# Patient Record
Sex: Female | Born: 1975
Health system: Southern US, Community
[De-identification: ages and names within clinical notes are randomized; demographics above are authoritative.]

## PROBLEM LIST (undated history)

## (undated) DIAGNOSIS — T7840XA Allergy, unspecified, initial encounter: Secondary | ICD-10-CM

## (undated) DIAGNOSIS — N62 Hypertrophy of breast: Secondary | ICD-10-CM

## (undated) DIAGNOSIS — S61419A Laceration without foreign body of unspecified hand, initial encounter: Secondary | ICD-10-CM

## (undated) DIAGNOSIS — M542 Cervicalgia: Secondary | ICD-10-CM

## (undated) DIAGNOSIS — M549 Dorsalgia, unspecified: Secondary | ICD-10-CM

## (undated) HISTORY — DX: Allergy, unspecified, initial encounter: T78.40XA

## (undated) HISTORY — DX: Dorsalgia, unspecified: M54.9

## (undated) HISTORY — DX: Cervicalgia: M54.2

---

## 2011-01-31 ENCOUNTER — Encounter: Payer: Self-pay | Admitting: Internal Medicine

## 2011-01-31 ENCOUNTER — Ambulatory Visit (INDEPENDENT_AMBULATORY_CARE_PROVIDER_SITE_OTHER): Payer: 59 | Admitting: Internal Medicine

## 2011-01-31 VITALS — BP 118/60 | HR 80 | Temp 98.5°F | Ht 60.0 in | Wt 132.0 lb

## 2011-01-31 DIAGNOSIS — J45909 Unspecified asthma, uncomplicated: Secondary | ICD-10-CM

## 2011-01-31 DIAGNOSIS — Z23 Encounter for immunization: Secondary | ICD-10-CM

## 2011-01-31 DIAGNOSIS — J309 Allergic rhinitis, unspecified: Secondary | ICD-10-CM
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
## 2011-02-24 ENCOUNTER — Encounter: Payer: Self-pay | Admitting: Internal Medicine

## 2011-02-24 DIAGNOSIS — J309 Allergic rhinitis, unspecified: Secondary | ICD-10-CM | POA: Insufficient documentation

## 2011-02-24 DIAGNOSIS — J45909 Unspecified asthma, uncomplicated: Secondary | ICD-10-CM | POA: Insufficient documentation

## 2011-02-24 NOTE — Patient Instructions (Signed)
Please continue asthma medications as needed. Return in one year or as needed

## 2011-03-12 ENCOUNTER — Other Ambulatory Visit: Payer: 59 | Admitting: Internal Medicine

## 2011-03-15 ENCOUNTER — Encounter: Payer: 59 | Admitting: Internal Medicine

## 2011-05-14 ENCOUNTER — Other Ambulatory Visit: Payer: 59 | Admitting: Internal Medicine

## 2011-05-16 ENCOUNTER — Encounter: Payer: 59 | Admitting: Internal Medicine

## 2012-07-30 ENCOUNTER — Other Ambulatory Visit (HOSPITAL_COMMUNITY)
Admission: RE | Admit: 2012-07-30 | Discharge: 2012-07-30 | Disposition: A | Discharge status: Home / Self Care | Payer: 59 | Source: Ambulatory Visit | Attending: Obstetrics and Gynecology | Admitting: Obstetrics and Gynecology

## 2012-07-30 ENCOUNTER — Other Ambulatory Visit: Payer: Self-pay | Admitting: Nurse Practitioner

## 2012-07-30 DIAGNOSIS — Z01419 Encounter for gynecological examination (general) (routine) without abnormal findings: Secondary | ICD-10-CM | POA: Insufficient documentation

## 2012-07-30 DIAGNOSIS — Z1151 Encounter for screening for human papillomavirus (HPV): Secondary | ICD-10-CM | POA: Insufficient documentation

## 2014-04-18 ENCOUNTER — Other Ambulatory Visit: Payer: Self-pay | Admitting: Internal Medicine

## 2014-04-18 DIAGNOSIS — Z1231 Encounter for screening mammogram for malignant neoplasm of breast: Secondary | ICD-10-CM

## 2014-05-04 ENCOUNTER — Ambulatory Visit: Payer: 59

## 2014-05-13 ENCOUNTER — Ambulatory Visit
Admission: RE | Admit: 2014-05-13 | Discharge: 2014-05-13 | Disposition: A | Discharge status: Home / Self Care | Payer: 59 | Source: Ambulatory Visit | Attending: Internal Medicine | Admitting: Internal Medicine

## 2014-05-13 DIAGNOSIS — Z1231 Encounter for screening mammogram for malignant neoplasm of breast: Secondary | ICD-10-CM

## 2015-01-26 ENCOUNTER — Emergency Department (HOSPITAL_COMMUNITY)
Admission: EM | Admit: 2015-01-26 | Discharge: 2015-01-26 | Disposition: A | Discharge status: Home / Self Care | Payer: 59 | Attending: Emergency Medicine | Admitting: Emergency Medicine

## 2015-01-26 ENCOUNTER — Encounter (HOSPITAL_COMMUNITY): Payer: Self-pay | Admitting: Emergency Medicine

## 2015-01-26 DIAGNOSIS — S61212A Laceration without foreign body of right middle finger without damage to nail, initial encounter: Secondary | ICD-10-CM | POA: Diagnosis not present

## 2015-01-26 DIAGNOSIS — S61214A Laceration without foreign body of right ring finger without damage to nail, initial encounter: Secondary | ICD-10-CM | POA: Insufficient documentation

## 2015-01-26 DIAGNOSIS — Y9289 Other specified places as the place of occurrence of the external cause: Secondary | ICD-10-CM | POA: Diagnosis not present

## 2015-01-26 DIAGNOSIS — Y9389 Activity, other specified: Secondary | ICD-10-CM | POA: Diagnosis not present

## 2015-01-26 DIAGNOSIS — Z91040 Latex allergy status: Secondary | ICD-10-CM | POA: Diagnosis not present

## 2015-01-26 DIAGNOSIS — Z7951 Long term (current) use of inhaled steroids: Secondary | ICD-10-CM | POA: Insufficient documentation

## 2015-01-26 DIAGNOSIS — Y998 Other external cause status: Secondary | ICD-10-CM | POA: Insufficient documentation

## 2015-01-26 DIAGNOSIS — S61216A Laceration without foreign body of right little finger without damage to nail, initial encounter: Secondary | ICD-10-CM | POA: Insufficient documentation

## 2015-01-26 DIAGNOSIS — W260XXA Contact with knife, initial encounter: Secondary | ICD-10-CM | POA: Diagnosis not present

## 2015-01-26 DIAGNOSIS — J45909 Unspecified asthma, uncomplicated: Secondary | ICD-10-CM | POA: Insufficient documentation

## 2015-01-26 DIAGNOSIS — S61219A Laceration without foreign body of unspecified finger without damage to nail, initial encounter: Secondary | ICD-10-CM

## 2015-01-26 DIAGNOSIS — Z23 Encounter for immunization: Secondary | ICD-10-CM | POA: Diagnosis not present

## 2015-01-26 MED ORDER — TETANUS-DIPHTH-ACELL PERTUSSIS 5-2.5-18.5 LF-MCG/0.5 IM SUSP
0.5000 mL | Freq: Once | INTRAMUSCULAR | Status: AC
  Administered 2015-01-26: 0.5 mL via INTRAMUSCULAR
  Filled 2015-01-26: qty 0.5

## 2015-01-26 MED ORDER — CEPHALEXIN 500 MG PO CAPS: 500.0000 mg | ORAL_CAPSULE | Freq: Three times a day (TID) | ORAL | Status: DC

## 2015-01-26 MED ORDER — CEPHALEXIN 500 MG PO CAPS
500.0000 mg | ORAL_CAPSULE | Freq: Once | ORAL | Status: AC
  Administered 2015-01-26: 500 mg via ORAL
  Filled 2015-01-26: qty 1

## 2015-01-26 MED ORDER — LIDOCAINE HCL (PF) 1 % IJ SOLN
5.0000 mL | Freq: Once | INTRAMUSCULAR | Status: AC
  Administered 2015-01-26: 5 mL
  Filled 2015-01-26: qty 5
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
## 2015-01-26 NOTE — Discharge Instructions (Signed)
Call Dr. Levell July office in the morning and tell them you were seen in the ED with a tendon laceration and need to follow up in the office. Take the Keflex as directed and take tylenol or advil for pain.

## 2015-01-26 NOTE — ED Provider Notes (Signed)
CSN: [id number]     Arrival date & time 01/26/15  1926 History  By signing my name below, I, Irene Pap, attest that this documentation has been prepared under the direction and in the presence of [facility], NP-C. Electronically Signed: Irene Pap, ED Scribe. 01/26/2015. 9:46 PM.  Chief Complaint  Patient presents with  . Extremity Laceration   Patient is a 39 y.o. female presenting with skin laceration. The history is provided by the patient. No language interpreter was used.  Laceration Location:  Hand Hand laceration location:  R hand and R finger Depth:  Cutaneous Quality: straight   Bleeding: controlled   Time since incident:  30 minutes Laceration mechanism:  Knife Pain details:    Severity:  Mild   Timing:  Constant   Progression:  Unchanged Foreign body present:  No foreign bodies Ineffective treatments:  None tried Tetanus status:  Unknown  HPI Comments: Kathryn Cohen is a 39 y.o. female who presents to the Emergency Department complaining of a laceration to the 3rd, 4th, and 5th fingers of the right hand onset 30 minutes ago. Pt states she cut her hand with a kitchen knife. She reports her pain 4/10 currently. Denies numbness. Pt does not know when her last tdap was.   Past Medical History  Diagnosis Date  . Asthma   . Allergy   . Back pain   . Neck pain    History reviewed. No pertinent past surgical history. Family History  Problem Relation Age of Onset  . Lupus Sister   . CAD Father   . Heart attack Other    Social History  Substance Use Topics  . Smoking status: Never Smoker   . Smokeless tobacco: None  . Alcohol Use: Yes     Comment: social   OB History    No data available     Review of Systems  Skin: Positive for wound.  All other systems reviewed and are negative.  Allergies  Latex  Home Medications   Prior to Admission medications   Medication Sig Start Date End Date Taking? Authorizing Provider  Albuterol (VENTOLIN IN)  Inhale into the lungs.      Historical Provider, MD  cephALEXin (KEFLEX) 500 MG capsule Take 1 capsule (500 mg total) by mouth 3 (three) times daily. 01/26/15   Hope Bunnie Pion, NP  fluticasone (FLONASE) 50 MCG/ACT nasal spray Place 2 sprays into the nose daily.      Historical Provider, MD  Fluticasone-Salmeterol (ADVAIR DISKUS IN) Inhale into the lungs.      Historical Provider, MD  montelukast (SINGULAIR) 10 MG tablet Take 10 mg by mouth at bedtime.      Historical Provider, MD   BP 113/56 mmHg  Pulse 74  Temp(Src) 98.3 F (36.8 C) (Oral)  Resp 18  SpO2 98%  LMP 12/31/2014 (Approximate)  Physical Exam  Constitutional: She is oriented to person, place, and time. She appears well-developed and well-nourished.  HENT:  Head: Normocephalic and atraumatic.  Eyes: EOM are normal.  Neck: Normal range of motion. Neck supple.  Cardiovascular: Normal rate.   Pulmonary/Chest: Effort normal.  Musculoskeletal: Normal range of motion.       Hands: Unable to flex ring finger, tendon visible with laceration.   Neurological: She is alert and oriented to person, place, and time.  Skin: Skin is warm and dry.  Laceration to PIP of middle, ring and pinky fingers and lacerated tendon to the ring finger, all on palmar surface of right hand.  Cap refill < 3 seconds. Equal sensation in all fingers.   Psychiatric: She has a normal mood and affect. Her behavior is normal.  Nursing note and vitals reviewed.   ED Course  Procedures (including critical care time) DIAGNOSTIC STUDIES: Oxygen Saturation is 98% on RA, normal by my interpretation.    COORDINATION OF CARE: 8:12 PM-Discussed treatment plan which includes call to hand surgeon and laceration repair with pt at bedside and pt agreed to plan.   8:44 PM- Consult with Dr. Fredna Dow. Will give pt dorsal hand splint after wounds cleaned and sutured.   LACERATION REPAIR Performed by: Debroah Baller, NP-C Consent: Verbal consent obtained. Risks and benefits:  risks, benefits and alternatives were discussed Patient identity confirmed: provided demographic data Time out performed prior to procedure Prepped and Draped in normal sterile fashion Wound explored Laceration Location: Palmar surface of right middle, ring and fifth fingers Laceration Length total of the three: 5.5 cm No Foreign Bodies seen or palpated Anesthesia: local infiltration Local anesthetic: lidocaine 1% w/o epinephrine Anesthetic total: 5 ml Irrigation method: syringe Amount of cleaning: standard Skin closure: 5.0 prolene Number of sutures: 9 total  Technique: simple interrupted Patient tolerance: Patient tolerated the procedure well with no immediate complications. Dressing Volar splint Keflex and follow up with Dr. Fredna Dow, she will call in the morning for follow up.  MDM  39 y.o. female with laceration to middle, ring and little finger of right hand stable for d/c to follow up with Dr. Fredna Dow. Discussed with the patient and all questioned fully answered. She will return here as needed.   Final diagnoses:  Laceration of finger with tendon involvement, initial encounter   I personally performed the services described in this documentation, which was scribed in my presence. The recorded information has been reviewed and is accurate.    [place], NP 01/29/15 1635  Harvel Quale, MD 01/30/15 2142

## 2015-01-26 NOTE — ED Notes (Signed)
Suture tray at bedside.

## 2015-01-30 ENCOUNTER — Other Ambulatory Visit: Payer: Self-pay | Admitting: Orthopedic Surgery

## 2015-01-31 ENCOUNTER — Encounter (HOSPITAL_BASED_OUTPATIENT_CLINIC_OR_DEPARTMENT_OTHER): Payer: Self-pay | Admitting: *Deleted

## 2015-02-02 ENCOUNTER — Ambulatory Visit (HOSPITAL_BASED_OUTPATIENT_CLINIC_OR_DEPARTMENT_OTHER)
Admission: RE | Admit: 2015-02-02 | Discharge: 2015-02-02 | Disposition: A | Discharge status: Home / Self Care | Payer: 59 | Source: Ambulatory Visit | Attending: Orthopedic Surgery | Admitting: Orthopedic Surgery

## 2015-02-02 ENCOUNTER — Encounter (HOSPITAL_BASED_OUTPATIENT_CLINIC_OR_DEPARTMENT_OTHER): Payer: Self-pay | Admitting: *Deleted

## 2015-02-02 ENCOUNTER — Encounter (HOSPITAL_BASED_OUTPATIENT_CLINIC_OR_DEPARTMENT_OTHER)
Admission: RE | Disposition: A | Discharge status: Home / Self Care | Payer: Self-pay | Source: Ambulatory Visit | Attending: Orthopedic Surgery

## 2015-02-02 ENCOUNTER — Ambulatory Visit (HOSPITAL_BASED_OUTPATIENT_CLINIC_OR_DEPARTMENT_OTHER): Payer: 59 | Admitting: Anesthesiology

## 2015-02-02 DIAGNOSIS — S65514A Laceration of blood vessel of right ring finger, initial encounter: Secondary | ICD-10-CM | POA: Diagnosis not present

## 2015-02-02 DIAGNOSIS — S64492A Injury of digital nerve of right middle finger, initial encounter: Secondary | ICD-10-CM | POA: Insufficient documentation

## 2015-02-02 DIAGNOSIS — Y998 Other external cause status: Secondary | ICD-10-CM | POA: Diagnosis not present

## 2015-02-02 DIAGNOSIS — S64494A Injury of digital nerve of right ring finger, initial encounter: Secondary | ICD-10-CM | POA: Insufficient documentation

## 2015-02-02 DIAGNOSIS — S66124A Laceration of flexor muscle, fascia and tendon of right ring finger at wrist and hand level, initial encounter: Secondary | ICD-10-CM | POA: Diagnosis not present

## 2015-02-02 DIAGNOSIS — W260XXA Contact with knife, initial encounter: Secondary | ICD-10-CM | POA: Insufficient documentation

## 2015-02-02 DIAGNOSIS — S65516A Laceration of blood vessel of right little finger, initial encounter: Secondary | ICD-10-CM | POA: Insufficient documentation

## 2015-02-02 DIAGNOSIS — Y93G1 Activity, food preparation and clean up: Secondary | ICD-10-CM | POA: Diagnosis not present

## 2015-02-02 DIAGNOSIS — Z91040 Latex allergy status: Secondary | ICD-10-CM | POA: Insufficient documentation

## 2015-02-02 DIAGNOSIS — S66126A Laceration of flexor muscle, fascia and tendon of right little finger at wrist and hand level, initial encounter: Secondary | ICD-10-CM | POA: Diagnosis not present

## 2015-02-02 DIAGNOSIS — S65512A Laceration of blood vessel of right middle finger, initial encounter: Secondary | ICD-10-CM | POA: Insufficient documentation

## 2015-02-02 DIAGNOSIS — S64496A Injury of digital nerve of right little finger, initial encounter: Secondary | ICD-10-CM | POA: Insufficient documentation

## 2015-02-02 DIAGNOSIS — J45909 Unspecified asthma, uncomplicated: Secondary | ICD-10-CM | POA: Insufficient documentation

## 2015-02-02 DIAGNOSIS — Y92090 Kitchen in other non-institutional residence as the place of occurrence of the external cause: Secondary | ICD-10-CM | POA: Diagnosis not present

## 2015-02-02 DIAGNOSIS — S66122A Laceration of flexor muscle, fascia and tendon of right middle finger at wrist and hand level, initial encounter: Secondary | ICD-10-CM | POA: Insufficient documentation

## 2015-02-02 HISTORY — DX: Laceration without foreign body of unspecified hand, initial encounter: S61.419A

## 2015-02-02 HISTORY — PX: NERVE, TENDON AND ARTERY REPAIR: SHX5695

## 2015-02-02 SURGERY — NERVE, TENDON AND ARTERY REPAIR
Anesthesia: General | Site: Finger | Laterality: Right

## 2015-02-02 MED ORDER — LIDOCAINE HCL (PF) 1 % IJ SOLN
INTRAMUSCULAR | Status: AC
  Filled 2015-02-02: qty 30

## 2015-02-02 MED ORDER — PROMETHAZINE HCL 25 MG/ML IJ SOLN: 6.2500 mg | INTRAMUSCULAR | Status: DC | PRN

## 2015-02-02 MED ORDER — HYDROCODONE-ACETAMINOPHEN 5-325 MG PO TABS: ORAL_TABLET | ORAL | Status: DC

## 2015-02-02 MED ORDER — MIDAZOLAM HCL 2 MG/2ML IJ SOLN
INTRAMUSCULAR | Status: AC
  Filled 2015-02-02: qty 4

## 2015-02-02 MED ORDER — DEXAMETHASONE SODIUM PHOSPHATE 10 MG/ML IJ SOLN
INTRAMUSCULAR | Status: AC
  Filled 2015-02-02: qty 1

## 2015-02-02 MED ORDER — LIDOCAINE HCL (CARDIAC) 20 MG/ML IV SOLN
INTRAVENOUS | Status: AC
  Filled 2015-02-02: qty 5

## 2015-02-02 MED ORDER — SCOPOLAMINE 1 MG/3DAYS TD PT72: 1.0000 | MEDICATED_PATCH | Freq: Once | TRANSDERMAL | Status: DC | PRN

## 2015-02-02 MED ORDER — 0.9 % SODIUM CHLORIDE (POUR BTL) OPTIME
TOPICAL | Status: DC | PRN
  Administered 2015-02-02: 200 mL

## 2015-02-02 MED ORDER — ONDANSETRON HCL 4 MG/2ML IJ SOLN
INTRAMUSCULAR | Status: AC
  Filled 2015-02-02: qty 2

## 2015-02-02 MED ORDER — FENTANYL CITRATE (PF) 100 MCG/2ML IJ SOLN: 25.0000 ug | INTRAMUSCULAR | Status: DC | PRN

## 2015-02-02 MED ORDER — FENTANYL CITRATE (PF) 100 MCG/2ML IJ SOLN
INTRAMUSCULAR | Status: AC
  Filled 2015-02-02: qty 2

## 2015-02-02 MED ORDER — MIDAZOLAM HCL 2 MG/2ML IJ SOLN
INTRAMUSCULAR | Status: AC
  Filled 2015-02-02: qty 2

## 2015-02-02 MED ORDER — KETOROLAC TROMETHAMINE 30 MG/ML IJ SOLN
INTRAMUSCULAR | Status: DC | PRN
  Administered 2015-02-02: 30 mg via INTRAVENOUS

## 2015-02-02 MED ORDER — LACTATED RINGERS IV SOLN
INTRAVENOUS | Status: DC
  Administered 2015-02-02 (×2): via INTRAVENOUS

## 2015-02-02 MED ORDER — SUCCINYLCHOLINE CHLORIDE 20 MG/ML IJ SOLN
INTRAMUSCULAR | Status: AC
  Filled 2015-02-02: qty 1

## 2015-02-02 MED ORDER — CEFAZOLIN SODIUM-DEXTROSE 2-3 GM-% IV SOLR
2.0000 g | INTRAVENOUS | Status: AC
  Administered 2015-02-02: 2 g via INTRAVENOUS

## 2015-02-02 MED ORDER — GLYCOPYRROLATE 0.2 MG/ML IJ SOLN: 0.2000 mg | Freq: Once | INTRAMUSCULAR | Status: DC | PRN

## 2015-02-02 MED ORDER — LIDOCAINE-EPINEPHRINE (PF) 1.5 %-1:200000 IJ SOLN
INTRAMUSCULAR | Status: DC | PRN
  Administered 2015-02-02: 15 mL via PERINEURAL

## 2015-02-02 MED ORDER — MIDAZOLAM HCL 2 MG/2ML IJ SOLN
1.0000 mg | INTRAMUSCULAR | Status: DC | PRN
  Administered 2015-02-02: 1 mg via INTRAVENOUS
  Administered 2015-02-02: 2 mg via INTRAVENOUS

## 2015-02-02 MED ORDER — LIDOCAINE HCL (CARDIAC) 20 MG/ML IV SOLN
INTRAVENOUS | Status: DC | PRN
  Administered 2015-02-02: 60 mg via INTRAVENOUS

## 2015-02-02 MED ORDER — CHLORHEXIDINE GLUCONATE 4 % EX LIQD: 60.0000 mL | Freq: Once | CUTANEOUS | Status: DC

## 2015-02-02 MED ORDER — DEXAMETHASONE SODIUM PHOSPHATE 10 MG/ML IJ SOLN
INTRAMUSCULAR | Status: DC | PRN
  Administered 2015-02-02: 10 mg via INTRAVENOUS

## 2015-02-02 MED ORDER — ROPIVACAINE HCL 5 MG/ML IJ SOLN
INTRAMUSCULAR | Status: DC | PRN
  Administered 2015-02-02: 15 mL via PERINEURAL

## 2015-02-02 MED ORDER — KETOROLAC TROMETHAMINE 30 MG/ML IJ SOLN: 30.0000 mg | Freq: Once | INTRAMUSCULAR | Status: DC

## 2015-02-02 MED ORDER — PROPOFOL 10 MG/ML IV BOLUS
INTRAVENOUS | Status: AC
  Filled 2015-02-02: qty 20

## 2015-02-02 MED ORDER — FENTANYL CITRATE (PF) 100 MCG/2ML IJ SOLN
50.0000 ug | INTRAMUSCULAR | Status: AC | PRN
  Administered 2015-02-02 (×3): 25 ug via INTRAVENOUS
  Administered 2015-02-02: 100 ug via INTRAVENOUS
  Administered 2015-02-02: 25 ug via INTRAVENOUS

## 2015-02-02 MED ORDER — HEPARIN SODIUM (PORCINE) 1000 UNIT/ML IJ SOLN
INTRAMUSCULAR | Status: AC
  Filled 2015-02-02: qty 1

## 2015-02-02 MED ORDER — EPHEDRINE SULFATE 50 MG/ML IJ SOLN
INTRAMUSCULAR | Status: DC | PRN
  Administered 2015-02-02: 10 mg via INTRAVENOUS

## 2015-02-02 MED ORDER — PROPOFOL 10 MG/ML IV BOLUS
INTRAVENOUS | Status: DC | PRN
Start: 2015-02-02 — End: 2015-02-02
  Administered 2015-02-02: 30 mg via INTRAVENOUS
  Administered 2015-02-02: 200 mg via INTRAVENOUS

## 2015-02-02 MED ORDER — FENTANYL CITRATE (PF) 100 MCG/2ML IJ SOLN
INTRAMUSCULAR | Status: AC
  Filled 2015-02-02: qty 4

## 2015-02-02 MED ORDER — ONDANSETRON HCL 4 MG/2ML IJ SOLN
INTRAMUSCULAR | Status: DC | PRN
Start: 2015-02-02 — End: 2015-02-02
  Administered 2015-02-02: 4 mg via INTRAVENOUS

## 2015-02-02 MED ORDER — CEFAZOLIN SODIUM-DEXTROSE 2-3 GM-% IV SOLR
INTRAVENOUS | Status: AC
  Filled 2015-02-02: qty 50

## 2015-02-02 SURGICAL SUPPLY — 63 items
BAG DECANTER FOR FLEXI CONT (MISCELLANEOUS) ×2 IMPLANT
BANDAGE ELASTIC 3 VELCRO ST LF (GAUZE/BANDAGES/DRESSINGS) ×2 IMPLANT
BLADE MINI RND TIP GREEN BEAV (BLADE) IMPLANT
BLADE SURG 15 STRL LF DISP TIS (BLADE) ×2 IMPLANT
BLADE SURG 15 STRL SS (BLADE) ×2
BNDG ESMARK 4X9 LF (GAUZE/BANDAGES/DRESSINGS) ×2 IMPLANT
BNDG GAUZE ELAST 4 BULKY (GAUZE/BANDAGES/DRESSINGS) ×2 IMPLANT
BRUSH SCRUB EZ PLAIN DRY (MISCELLANEOUS) IMPLANT
CATH ROBINSON RED A/P 10FR (CATHETERS) IMPLANT
CHLORAPREP W/TINT 26ML (MISCELLANEOUS) IMPLANT
CORDS BIPOLAR (ELECTRODE) ×2 IMPLANT
COVER BACK TABLE 60X90IN (DRAPES) ×2 IMPLANT
COVER MAYO STAND STRL (DRAPES) ×2 IMPLANT
CUFF TOURNIQUET SINGLE 18IN (TOURNIQUET CUFF) ×2 IMPLANT
DECANTER SPIKE VIAL GLASS SM (MISCELLANEOUS) IMPLANT
DRAPE EXTREMITY T 121X128X90 (DRAPE) ×2 IMPLANT
DRAPE SURG 17X23 STRL (DRAPES) ×2 IMPLANT
GAUZE SPONGE 4X4 12PLY STRL (GAUZE/BANDAGES/DRESSINGS) ×2 IMPLANT
GAUZE XEROFORM 1X8 LF (GAUZE/BANDAGES/DRESSINGS) ×2 IMPLANT
GLOVE BIO SURGEON STRL SZ7.5 (GLOVE) IMPLANT
GLOVE BIOGEL PI IND STRL 8 (GLOVE) ×1 IMPLANT
GLOVE BIOGEL PI IND STRL 8.5 (GLOVE) IMPLANT
GLOVE BIOGEL PI INDICATOR 8 (GLOVE) ×1
GLOVE BIOGEL PI INDICATOR 8.5 (GLOVE)
GLOVE SURG ORTHO 8.0 STRL STRW (GLOVE) IMPLANT
GLOVE SURG SS PI 7.0 STRL IVOR (GLOVE) ×2 IMPLANT
GLOVE SURG SS PI 8.0 STRL IVOR (GLOVE) ×2 IMPLANT
GOWN STRL REUS W/ TWL LRG LVL3 (GOWN DISPOSABLE) ×1 IMPLANT
GOWN STRL REUS W/TWL LRG LVL3 (GOWN DISPOSABLE) ×1
GOWN STRL REUS W/TWL XL LVL3 (GOWN DISPOSABLE) ×4 IMPLANT
LOOP VESSEL MAXI BLUE (MISCELLANEOUS) IMPLANT
NDL SAFETY ECLIPSE 18X1.5 (NEEDLE) IMPLANT
NEEDLE HYPO 18GX1.5 SHARP (NEEDLE)
NEEDLE HYPO 25X1 1.5 SAFETY (NEEDLE) ×2 IMPLANT
NS IRRIG 1000ML POUR BTL (IV SOLUTION) ×2 IMPLANT
PACK BASIN DAY SURGERY FS (CUSTOM PROCEDURE TRAY) ×2 IMPLANT
PAD CAST 3X4 CTTN HI CHSV (CAST SUPPLIES) ×1 IMPLANT
PAD CAST 4YDX4 CTTN HI CHSV (CAST SUPPLIES) IMPLANT
PADDING CAST ABS 4INX4YD NS (CAST SUPPLIES)
PADDING CAST ABS COTTON 4X4 ST (CAST SUPPLIES) IMPLANT
PADDING CAST COTTON 3X4 STRL (CAST SUPPLIES) ×1
PADDING CAST COTTON 4X4 STRL (CAST SUPPLIES)
SLEEVE SCD COMPRESS KNEE MED (MISCELLANEOUS) ×2 IMPLANT
SLING ARM FOAM STRAP MED (SOFTGOODS) ×2 IMPLANT
SPEAR EYE SURG WECK-CEL (MISCELLANEOUS) ×2 IMPLANT
SPLINT PLASTER CAST XFAST 3X15 (CAST SUPPLIES) IMPLANT
SPLINT PLASTER XTRA FASTSET 3X (CAST SUPPLIES)
STOCKINETTE 4X48 STRL (DRAPES) ×2 IMPLANT
SUT ETHIBOND 3-0 V-5 (SUTURE) IMPLANT
SUT ETHILON 4 0 PS 2 18 (SUTURE) ×6 IMPLANT
SUT FIBERWIRE 4-0 18 TAPR NDL (SUTURE) ×2
SUT MERSILENE 6 0 P 1 (SUTURE) IMPLANT
SUT NYLON 9 0 VRM6 (SUTURE) ×4 IMPLANT
SUT PROLENE 6 0 P 1 18 (SUTURE) IMPLANT
SUT SILK 4 0 PS 2 (SUTURE) IMPLANT
SUT SUPRAMID 4-0 (SUTURE) IMPLANT
SUT VICRYL 4-0 PS2 18IN ABS (SUTURE) ×4 IMPLANT
SUTURE FIBERWR 4-0 18 TAPR NDL (SUTURE) ×1 IMPLANT
SYR BULB 3OZ (MISCELLANEOUS) ×2 IMPLANT
SYR CONTROL 10ML LL (SYRINGE) IMPLANT
TOWEL OR 17X24 6PK STRL BLUE (TOWEL DISPOSABLE) ×4 IMPLANT
TRAY DSU PREP LF (CUSTOM PROCEDURE TRAY) ×2 IMPLANT
UNDERPAD 30X30 (UNDERPADS AND DIAPERS) ×2 IMPLANT

## 2015-02-02 NOTE — Progress Notes (Signed)
Assisted Dr. Kalman Shan with right, axillary block. Side rails up, monitors on throughout procedure. See vital signs in flow sheet. Tolerated Procedure well.

## 2015-02-02 NOTE — Anesthesia Postprocedure Evaluation (Signed)
Anesthesia Post Note  Patient: Kathryn Cohen  Procedure(s) Performed: Procedure(s) (LRB): RIGHT LONG RING SMALL FINGER REPAIR NERVE, TENDON AND ARTERY REPAIR (Right)  Anesthesia type: general  Patient location: PACU  Post pain: Pain level controlled  Post assessment: Patient's Cardiovascular Status Stable  Last Vitals:  Filed Vitals:   02/02/15 1615  BP: 119/61  Pulse: 77  Temp:   Resp: 10    Post vital signs: Reviewed and stable  Level of consciousness: sedated  Complications: No apparent anesthesia complications

## 2015-02-02 NOTE — Op Note (Signed)
Intra-operative fluoroscopic images in the AP, lateral, and oblique views were taken and evaluated by myself.  No radio opaque body was noted in the operative area.

## 2015-02-02 NOTE — Op Note (Signed)
[id number] 

## 2015-02-02 NOTE — Anesthesia Preprocedure Evaluation (Addendum)
Anesthesia Evaluation  Patient identified by MRN, date of birth, ID band Patient awake    Reviewed: Allergy & Precautions, NPO status , Patient's Chart, lab work & pertinent test results  History of Anesthesia Complications Negative for: history of anesthetic complications  Airway Mallampati: II  TM Distance: >3 FB Neck ROM: Full    Dental no notable dental hx. (+) Dental Advisory Given   Pulmonary asthma ,    Pulmonary exam normal breath sounds clear to auscultation       Cardiovascular negative cardio ROS Normal cardiovascular exam Rhythm:Regular Rate:Normal     Neuro/Psych negative neurological ROS     GI/Hepatic Neg liver ROS,   Endo/Other  negative endocrine ROS  Renal/GU negative Renal ROS     Musculoskeletal Back pain   Abdominal   Peds  Hematology   Anesthesia Other Findings   Reproductive/Obstetrics                            Anesthesia Physical Anesthesia Plan  ASA: II  Anesthesia Plan: General   Post-op Pain Management:    Induction: Intravenous  Airway Management Planned: LMA  Additional Equipment:   Intra-op Plan:   Post-operative Plan: Extubation in OR  Informed Consent: I have reviewed the patients History and Physical, chart, labs and discussed the procedure including the risks, benefits and alternatives for the proposed anesthesia with the patient or authorized representative who has indicated his/her understanding and acceptance.   Dental advisory given  Plan Discussed with:   Anesthesia Plan Comments:         Anesthesia Quick Evaluation

## 2015-02-02 NOTE — H&P (Signed)
  Kathryn Cohen is an 39 y.o. female.   Chief Complaint: right long/ring/small lacerations HPI: 39 yo rhd female states she was cleaning a kitchen knife 01/26/15 when the knife slipped and lacerated her right long/ring/small fingers.  Seen at [facility] where wounds were cleaned and sutured.  Followed up in office.  Reports no previous injuries to fingers.  Unable to flex fingers.  Past Medical History  Diagnosis Date  . Asthma   . Allergy   . Back pain   . Neck pain   . Laceration of hand     rt long, ring and small fingers    Past Surgical History  Procedure Laterality Date  . No past surgeries      Family History  Problem Relation Age of Onset  . Lupus Sister   . CAD Father   . Heart attack Other    Social History:  reports that she has never smoked. She does not have any smokeless tobacco history on file. She reports that she drinks alcohol. She reports that she does not use illicit drugs.  Allergies:  Allergies  Allergen Reactions  . Latex Rash    Medications Prior to Admission  Medication Sig Dispense Refill  . cephALEXin (KEFLEX) 500 MG capsule Take 1 capsule (500 mg total) by mouth 3 (three) times daily. 21 capsule 0  . fluticasone (FLONASE) 50 MCG/ACT nasal spray Place 2 sprays into the nose daily.      . Fluticasone-Salmeterol (ADVAIR DISKUS IN) Inhale into the lungs.      . montelukast (SINGULAIR) 10 MG tablet Take 10 mg by mouth at bedtime.      . Albuterol (VENTOLIN IN) Inhale into the lungs.        No results found for this or any previous visit (from the past 48 hour(s)).  No results found.   A comprehensive review of systems was negative except for: Eyes: positive for contacts/glasses Respiratory: positive for asthma Musculoskeletal: positive for back pain  Height 5' (1.524 m), weight 58.968 kg (130 lb), last menstrual period 12/31/2014.  General appearance: alert, cooperative and appears stated age Head: Normocephalic, without obvious abnormality,  atraumatic Neck: supple, symmetrical, trachea midline Resp: clear to auscultation bilaterally Cardio: regular rate and rhythm GI: non tedner Extremities: decreased sensation on ulnar side of right long/ring/small fingers.  otherwise intact sensation.  brisk capillary refill all digits.  unable to flex right long/ring/small fingers.  lacerations at pip level right long/ring/small fingers. Pulses: 2+ and symmetric Skin: Skin color, texture, turgor normal. No rashes or lesions Neurologic: Grossly normal Incision/Wound:  as above  Assessment/Plan Right long/ring/small finger lacerations with tendon/artery/nerve lacerations.  Non operative and operative treatment options were discussed with the patient and patient wishes to proceed with operative treatment. Risks, benefits, and alternatives of surgery were discussed and the patient agrees with the plan of care.   Kathryn Cohen R 02/02/2015, 11:05 AM

## 2015-02-02 NOTE — Anesthesia Procedure Notes (Addendum)
Anesthesia Regional Block:  Axillary brachial plexus block  Pre-Anesthetic Checklist: ,, timeout performed, Correct Patient, Correct Site, Correct Laterality, Correct Procedure, Correct Position, site marked, Risks and benefits discussed,  Surgical consent,  Pre-op evaluation,  At surgeon's request and post-op pain management  Laterality: Right  Prep: chloraprep       Needles:  Injection technique: Single-shot  Needle Type: Stimiplex     Needle Length: 4cm 4 cm Needle Gauge: 21 and 21 G    Additional Needles: Axillary brachial plexus block  Nerve Stimulator or Paresthesia:  Response: 0.4 mA,   Additional Responses:   Narrative:  Injection made incrementally with aspirations every 5 mL.  Performed by: Personally   Additional Notes: Patient tolerated the procedure well without complications   Procedure Name: LMA Insertion Date/Time: 02/02/2015 1:13 PM Performed by: Maryella Shivers Pre-anesthesia Checklist: Patient identified, Emergency Drugs available, Suction available and Patient being monitored Patient Re-evaluated:Patient Re-evaluated prior to inductionOxygen Delivery Method: Circle System Utilized Preoxygenation: Pre-oxygenation with 100% oxygen Intubation Type: IV induction Ventilation: Mask ventilation without difficulty LMA: LMA inserted LMA Size: 4.0 Number of attempts: 1 Airway Equipment and Method: Bite block Placement Confirmation: positive ETCO2 Tube secured with: Tape Dental Injury: Teeth and Oropharynx as per pre-operative assessment

## 2015-02-02 NOTE — Discharge Instructions (Addendum)
[facility] Instructions Hand Surgery  Wound Care: Keep your hand elevated above the level of your heart.  Do not allow it to dangle by your side.  Keep the dressing dry and do not remove it unless your doctor advises you to do so.  He will usually change it at the time of your post-op visit.  Moving your fingers is advised to stimulate circulation but will depend on the site of your surgery.  If you have a splint applied, your doctor will advise you regarding movement.  Activity: Do not drive or operate machinery today.  Rest today and then you may return to your normal activity and work as indicated by your physician.  Diet:  Drink liquids today or eat a light diet.  You may resume a regular diet tomorrow.    General expectations: Pain for two to three days. Fingers may become slightly swollen.  Call your doctor if any of the following occur: Severe pain not relieved by pain medication. Elevated temperature. Dressing soaked with blood. Inability to move fingers. White or bluish color to fingers.    Post Anesthesia Home Care Instructions  Activity: Get plenty of rest for the remainder of the day. A responsible adult should stay with you for 24 hours following the procedure.  For the next 24 hours, DO NOT: -Drive a car -Paediatric nurse -Drink alcoholic beverages -Take any medication unless instructed by your physician -Make any legal decisions or sign important papers.  Meals: Start with liquid foods such as gelatin or soup. Progress to regular foods as tolerated. Avoid greasy, spicy, heavy foods. If nausea and/or vomiting occur, drink only clear liquids until the nausea and/or vomiting subsides. Call your physician if vomiting continues.  Special Instructions/Symptoms: Your throat may feel dry or sore from the anesthesia or the breathing tube placed in your throat during surgery. If this causes discomfort, gargle with warm salt water. The discomfort should disappear within  24 hours.  If you had a scopolamine patch placed behind your ear for the management of post- operative nausea and/or vomiting:  1. The medication in the patch is effective for 72 hours, after which it should be removed.  Wrap patch in a tissue and discard in the trash. Wash hands thoroughly with soap and water. 2. You may remove the patch earlier than 72 hours if you experience unpleasant side effects which may include dry mouth, dizziness or visual disturbances. 3. Avoid touching the patch. Wash your hands with soap and water after contact with the patch.    Regional Anesthesia Blocks  1. Numbness or the inability to move the "blocked" extremity may last from 3-48 hours after placement. The length of time depends on the medication injected and your individual response to the medication. If the numbness is not going away after 48 hours, call your surgeon.  2. The extremity that is blocked will need to be protected until the numbness is gone and the  Strength has returned. Because you cannot feel it, you will need to take extra care to avoid injury. Because it may be weak, you may have difficulty moving it or using it. You may not know what position it is in without looking at it while the block is in effect.  3. For blocks in the legs and feet, returning to weight bearing and walking needs to be done carefully. You will need to wait until the numbness is entirely gone and the strength has returned. You should be able to move your  leg and foot normally before you try and bear weight or walk. You will need someone to be with you when you first try to ensure you do not fall and possibly risk injury.  4. Bruising and tenderness at the needle site are common side effects and will resolve in a few days.  5. Persistent numbness or new problems with movement should be communicated to the surgeon or the [place] [phone] [place] [phone]).

## 2015-02-02 NOTE — Brief Op Note (Signed)
02/02/2015  4:03 PM  PATIENT:  Kathryn Cohen  39 y.o. female  PRE-OPERATIVE DIAGNOSIS:  RIGHT LONG RING SMALL TENDON ARTERY NERVE LACERATION  POST-OPERATIVE DIAGNOSIS:  RIGHT LONG RING SMALL TENDON ARTERY NERVE LACERATION  PROCEDURE:  Procedure(s): RIGHT LONG RING SMALL FINGER REPAIR NERVE, TENDON AND ARTERY REPAIR (Right)  SURGEON:  Surgeon(s) and Role:    * Leanora Cover, MD - Primary    * Daryll Brod, MD - Assisting  PHYSICIAN ASSISTANT:   ASSISTANTS: Daryll Brod, MD   ANESTHESIA:   regional and general  EBL:  Total I/O In: 1600 [I.V.:1600] Out: -   BLOOD ADMINISTERED:none  DRAINS: none   LOCAL MEDICATIONS USED:  NONE  SPECIMEN:  No Specimen  DISPOSITION OF SPECIMEN:  N/A  COUNTS:  NO Micro suture needle lost and ACTION TAKEN: X-RAY(S) TAKEN No radio opaque foreign body noted., ROOM SEARCHED and PATIENT AND/OR FAMILY AWARE  TOURNIQUET:   Total Tourniquet Time Documented: Upper Arm (Right) - 132 minutes Total: Upper Arm (Right) - 132 minutes   DICTATION: .Other Dictation: Dictation Number [id number]  PLAN OF CARE: Discharge to home after PACU  PATIENT DISPOSITION:  PACU - hemodynamically stable.

## 2015-02-02 NOTE — Transfer of Care (Signed)
Immediate Anesthesia Transfer of Care Note  Patient: Kathryn Cohen  Procedure(s) Performed: Procedure(s): RIGH LONG RING SMALL FINGER REPAIR NERVE, TENDON AND ARTERY REPAIR (Right)  Patient Location: PACU  Anesthesia Type:GA combined with regional for post-op pain  Level of Consciousness: sedated  Airway & Oxygen Therapy: Patient Spontanous Breathing and Patient connected to face mask oxygen  Post-op Assessment: Report given to RN and Post -op Vital signs reviewed and stable  Post vital signs: Reviewed and stable  Last Vitals:  Filed Vitals:   02/02/15 1302  BP:   Pulse: 64  Temp:   Resp: 14    Complications: No apparent anesthesia complications

## 2015-02-03 ENCOUNTER — Encounter (HOSPITAL_BASED_OUTPATIENT_CLINIC_OR_DEPARTMENT_OTHER): Payer: Self-pay | Admitting: Orthopedic Surgery

## 2015-02-03 NOTE — Op Note (Signed)
[name], STREB NO.:  [id number]  MEDICAL RECORD NO.:  [id number]  LOCATION:                               FACILITY:  [facility]  PHYSICIAN:  Leanora Cover, MD        DATE OF BIRTH:  [birth year]  DATE OF PROCEDURE:  02/02/2015 DATE OF DISCHARGE:  02/02/2015                              OPERATIVE REPORT   PREOPERATIVE DIAGNOSES:  Right long, ring, and small finger tendon, artery, and nerve laceration.  POSTOPERATIVE DIAGNOSES: 1. Right long finger FDP zone II laceration 2. Right long finger FDS zone II laceration 3. Right long finger ulnar digital nerve laceration  4. Right long finger ulnar digital artery laceration 5. Right ring finger FDP zone II laceration 6. Right ring finger FDS zone II laceration 7. Right ring finger ulnar digital nerve laceration  8. Right ring finger ulnar digital artery laceration 9. Right small finger FDP zone II laceration 10. Right small finger FDS zone II laceration 11. Right small finger ulnar digital nerve laceration  12. Right small finger ulnar digital artery laceration  PROCEDURE: 1. Right long finger repair of FDP zone II laceration 2. Right long finger repair of FDS zone II laceration 2. Right long finger repair of ulnar digital nerve under microscope 4. Right long finger repair of ulnar digital artery under microscope 5. Right ring finger repair of FDP zone II laceration 6. Right ring finger repair of FDS zone II laceration 7. Right ring finger repair of ulnar digital nerve under microscope 8. Right ring finger repair of ulnar digital artery under microscope 9. Right small finger repair of FDP zone II laceration 10. Right small finger repair of FDS zone II laceration 11. Right small finger repair of ulnar digital nerve under microscope 12. Right small finger repair of ulnar digital artery under microscope  SURGEON:  Leanora Cover, M.D.  ASSISTANT:  Daryll Brod, M.D.  ANESTHESIA:  General with Regional.  IV FLUIDS:   Per anesthesia flow sheet.  ESTIMATED BLOOD LOSS:  Minimal.  COMPLICATIONS:  None.  SPECIMENS:  None.  TOURNIQUET TIME:  132 minutes.  DISPOSITION:  Stable to PACU.  INDICATIONS:  Kathryn Cohen is a 39 year old female, who 1 week ago, lacerated the right long, ring, and small fingers while cleaning a kitchen knife.  She was seen at the emergency department, where the wounds were irrigated and sutured.  She followed up in the office.  She was unable to flex the fingers, the long, ring, and small fingers.  She had decreased sensation on the ulnar side of the long, ring, and small fingers.  We elected to proceed with operative exploration, repair of tendon, artery, and nerve as necessary.  Risks, benefits, and alternatives of surgery were discussed including risk of blood loss; infection; damage to nerves, vessels, tendons, ligaments, and bone; failure of surgery; need for additional surgery; complications with wound healing, continued pain, continued numbness and stiffness.  She voiced understanding of these risks and elected to proceed.  OPERATIVE COURSE:  After being identified preoperatively by myself, the patient and I agreed upon procedure and site of procedure.  Surgical site was marked.  The risks, benefits, and alternatives of surgery were reviewed  and she wished to proceed.  Surgical consent had been signed. She was given IV Ancef as preoperative antibiotic prophylaxis.  She was transferred to the operating room, placed on the operating table in a supine position, with the right upper extremity on arm board.  General anesthesia induced by anesthesiologist.  A regional block had been performed by Anesthesia in preoperative holding.  Right upper extremity was prepped and draped in normal sterile orthopedic fashion.  Surgical pause was performed between surgeons, anesthesia, operating staff, and all were in agreement as to the patient, procedure, site procedure. Tourniquet at  the proximal aspect of the extremity was inflated to 250 mmHg after exsanguination of the limb with an Esmarch bandage.  The wounds were explored and extended both proximally and distally.  There was FDP and FDS laceration in the zone II of the long, ring, and small fingers.  The radial digital nerve and artery were intact, in the long, ring, and small fingers.  The ulnar digital nerve and artery were lacerated in the long, ring, and small fingers.  Attention was turned to the tendons first.  4-0 FiberWire suture was used.  The small finger was addressed first.  The FDP radial slip was repaired with a figure-of- eight suture.  The FDP tendon was repaired with a modified Kessler style suture.  This provided good apposition of the tendon ends.  In the ring finger, both the radial and ulnar slips of the FDS tendon were repaired with the 4-0 FiberWire suture in a figure-of-eight fashion.  The FDP tendon was repaired with modified Kessler core suture and augmented with a 6-0 Prolene running epitendinous suture.  On the small finger, the core suture had been augmented with a figure-of-eight suture to provide better apposition of the tendon ends.  In the long finger, the radial and ulnar arms of the FDS tendon were repaired with figure-of-eight style 4-0 FiberWire suture and the FDP tendon repaired with a modified Kessler core suture with a running epitendinous 6-0 Prolene suture. This provided good apposition of all tendon ends.  The microscope was brought in.  A 9-0 nylon suture was used.  The small finger was addressed first.  The artery was repaired after cleaning the lumen and freshen the ends.  A 9-0 nylon suture was used in an interrupted circumferential fashion.  Good apposition of the arterial ends was obtained.  The ulnar digital nerve was then repaired again in an interrupted circumferential fashion.  The ring finger was addressed. Again, the arterial ends were cleaned.  The artery was  repaired with a 9- 0 nylon suture in an interrupted circumferential fashion.  This provided good apposition of the arterial ends.  The ulnar digital nerve was then repaired again in an interrupted circumferential fashion, providing good apposition of the nerve ends.  In the long finger, the artery was again cleaned, the 9-0 nylon suture was used in an interrupted circumferential fashion to repair the ulnar digital artery.  This provided good apposition of the arterial ends.  The ulnar digital nerve was repaired with the 9-0 nylon suture in an interrupted circumferential fashion providing good apposition.  While placing the first 9-0 nylon suture back into the suture carrier, it popped off from the needle holder.  At the end of the case, radiographs were taken of the patient's hand to ensure that the suture was not within the wound, which it was not.  The room was searched along with the operative field and the suture needle was unable  to be retrieved.  The family was made aware postoperatively. The wounds were copiously irrigated with sterile saline.  They were closed with 4-0 nylon in a horizontal mattress fashion.  They were then dressed with sterile Xeroform, 4x4s, and wrapped with a Kerlix bandage. A dorsal blocking splint was placed and wrapped with Kerlix and Ace bandage.  Tourniquet was deflated at 132 minutes.  Fingertips were pink with brisk capillary refill after deflation of the tourniquet.  The operative drapes were broken down.  The patient was awoken from anesthesia safely. She was transferred back to stretcher and taken to PACU in stable condition.  I will see her back in the office in 1 week for postoperative followup.  I have given her Norco 5/325, 1-2 p.o. q.6 hours p.r.n. pain, dispensed #40.     Leanora Cover, MD     KK/MEDQ  D:  02/02/2015  T:  02/03/2015  Job:  [id number]

## 2015-05-02 DIAGNOSIS — M6281 Muscle weakness (generalized): Secondary | ICD-10-CM | POA: Diagnosis not present

## 2015-05-02 DIAGNOSIS — M24542 Contracture, left hand: Secondary | ICD-10-CM | POA: Diagnosis not present

## 2015-05-02 DIAGNOSIS — M62442 Contracture of muscle, left hand: Secondary | ICD-10-CM | POA: Diagnosis not present

## 2015-05-02 DIAGNOSIS — S66821D Laceration of other specified muscles, fascia and tendons at wrist and hand level, right hand, subsequent encounter: Secondary | ICD-10-CM | POA: Diagnosis not present

## 2015-05-02 DIAGNOSIS — L905 Scar conditions and fibrosis of skin: Secondary | ICD-10-CM | POA: Diagnosis not present

## 2015-05-12 MED FILL — ELINEST-28 TABLET: 0.3-30 | 84 days supply | Qty: 112 | Fill #2

## 2015-05-16 DIAGNOSIS — M24542 Contracture, left hand: Secondary | ICD-10-CM | POA: Diagnosis not present

## 2015-05-16 DIAGNOSIS — M62442 Contracture of muscle, left hand: Secondary | ICD-10-CM | POA: Diagnosis not present

## 2015-05-16 DIAGNOSIS — M6281 Muscle weakness (generalized): Secondary | ICD-10-CM | POA: Diagnosis not present

## 2015-05-16 DIAGNOSIS — S66821D Laceration of other specified muscles, fascia and tendons at wrist and hand level, right hand, subsequent encounter: Secondary | ICD-10-CM | POA: Diagnosis not present

## 2015-05-16 DIAGNOSIS — L905 Scar conditions and fibrosis of skin: Secondary | ICD-10-CM | POA: Diagnosis not present

## 2015-05-22 DIAGNOSIS — M62442 Contracture of muscle, left hand: Secondary | ICD-10-CM | POA: Diagnosis not present

## 2015-05-22 DIAGNOSIS — L905 Scar conditions and fibrosis of skin: Secondary | ICD-10-CM | POA: Diagnosis not present

## 2015-05-22 DIAGNOSIS — M6281 Muscle weakness (generalized): Secondary | ICD-10-CM | POA: Diagnosis not present

## 2015-05-22 DIAGNOSIS — S66821D Laceration of other specified muscles, fascia and tendons at wrist and hand level, right hand, subsequent encounter: Secondary | ICD-10-CM | POA: Diagnosis not present

## 2015-05-23 DIAGNOSIS — Z0000 Encounter for general adult medical examination without abnormal findings: Secondary | ICD-10-CM | POA: Diagnosis not present

## 2015-05-23 DIAGNOSIS — J45909 Unspecified asthma, uncomplicated: Secondary | ICD-10-CM | POA: Diagnosis not present

## 2015-05-30 DIAGNOSIS — M62442 Contracture of muscle, left hand: Secondary | ICD-10-CM | POA: Diagnosis not present

## 2015-05-30 DIAGNOSIS — S66821D Laceration of other specified muscles, fascia and tendons at wrist and hand level, right hand, subsequent encounter: Secondary | ICD-10-CM | POA: Diagnosis not present

## 2015-05-30 DIAGNOSIS — L905 Scar conditions and fibrosis of skin: Secondary | ICD-10-CM | POA: Diagnosis not present

## 2015-05-30 DIAGNOSIS — M6281 Muscle weakness (generalized): Secondary | ICD-10-CM | POA: Diagnosis not present

## 2015-05-30 DIAGNOSIS — M24542 Contracture, left hand: Secondary | ICD-10-CM | POA: Diagnosis not present

## 2015-05-31 DIAGNOSIS — S66127D Laceration of flexor muscle, fascia and tendon of left little finger at wrist and hand level, subsequent encounter: Secondary | ICD-10-CM | POA: Diagnosis not present

## 2015-05-31 DIAGNOSIS — S65507D Unspecified injury of blood vessel of left little finger, subsequent encounter: Secondary | ICD-10-CM | POA: Diagnosis not present

## 2015-05-31 DIAGNOSIS — S65505D Unspecified injury of blood vessel of left ring finger, subsequent encounter: Secondary | ICD-10-CM | POA: Diagnosis not present

## 2015-05-31 DIAGNOSIS — S65503D Unspecified injury of blood vessel of left middle finger, subsequent encounter: Secondary | ICD-10-CM | POA: Diagnosis not present

## 2015-05-31 DIAGNOSIS — S64493D Injury of digital nerve of left middle finger, subsequent encounter: Secondary | ICD-10-CM | POA: Diagnosis not present

## 2015-05-31 DIAGNOSIS — S64497D Injury of digital nerve of left little finger, subsequent encounter: Secondary | ICD-10-CM | POA: Diagnosis not present

## 2015-05-31 DIAGNOSIS — S64495D Injury of digital nerve of left ring finger, subsequent encounter: Secondary | ICD-10-CM | POA: Diagnosis not present

## 2015-05-31 DIAGNOSIS — S66123D Laceration of flexor muscle, fascia and tendon of left middle finger at wrist and hand level, subsequent encounter: Secondary | ICD-10-CM | POA: Diagnosis not present

## 2015-05-31 DIAGNOSIS — S66125D Laceration of flexor muscle, fascia and tendon of left ring finger at wrist and hand level, subsequent encounter: Secondary | ICD-10-CM | POA: Diagnosis not present

## 2015-06-06 DIAGNOSIS — S64493D Injury of digital nerve of left middle finger, subsequent encounter: Secondary | ICD-10-CM | POA: Diagnosis not present

## 2015-06-06 DIAGNOSIS — S66127D Laceration of flexor muscle, fascia and tendon of left little finger at wrist and hand level, subsequent encounter: Secondary | ICD-10-CM | POA: Diagnosis not present

## 2015-06-06 DIAGNOSIS — L905 Scar conditions and fibrosis of skin: Secondary | ICD-10-CM | POA: Diagnosis not present

## 2015-06-06 DIAGNOSIS — M6281 Muscle weakness (generalized): Secondary | ICD-10-CM | POA: Diagnosis not present

## 2015-06-06 DIAGNOSIS — M24542 Contracture, left hand: Secondary | ICD-10-CM | POA: Diagnosis not present

## 2015-06-13 ENCOUNTER — Other Ambulatory Visit: Payer: Self-pay

## 2015-06-13 DIAGNOSIS — L905 Scar conditions and fibrosis of skin: Secondary | ICD-10-CM | POA: Diagnosis not present

## 2015-06-13 DIAGNOSIS — S66127D Laceration of flexor muscle, fascia and tendon of left little finger at wrist and hand level, subsequent encounter: Secondary | ICD-10-CM | POA: Diagnosis not present

## 2015-06-13 DIAGNOSIS — S66125D Laceration of flexor muscle, fascia and tendon of left ring finger at wrist and hand level, subsequent encounter: Secondary | ICD-10-CM | POA: Diagnosis not present

## 2015-06-13 DIAGNOSIS — Z1231 Encounter for screening mammogram for malignant neoplasm of breast: Secondary | ICD-10-CM

## 2015-06-13 DIAGNOSIS — S66123D Laceration of flexor muscle, fascia and tendon of left middle finger at wrist and hand level, subsequent encounter: Secondary | ICD-10-CM | POA: Diagnosis not present

## 2015-06-13 DIAGNOSIS — M6281 Muscle weakness (generalized): Secondary | ICD-10-CM | POA: Diagnosis not present

## 2015-06-19 DIAGNOSIS — M24542 Contracture, left hand: Secondary | ICD-10-CM | POA: Diagnosis not present

## 2015-06-19 DIAGNOSIS — M6281 Muscle weakness (generalized): Secondary | ICD-10-CM | POA: Diagnosis not present

## 2015-06-19 DIAGNOSIS — S66125D Laceration of flexor muscle, fascia and tendon of left ring finger at wrist and hand level, subsequent encounter: Secondary | ICD-10-CM | POA: Diagnosis not present

## 2015-06-19 DIAGNOSIS — L905 Scar conditions and fibrosis of skin: Secondary | ICD-10-CM | POA: Diagnosis not present

## 2015-06-27 DIAGNOSIS — S66123D Laceration of flexor muscle, fascia and tendon of left middle finger at wrist and hand level, subsequent encounter: Secondary | ICD-10-CM | POA: Diagnosis not present

## 2015-06-27 DIAGNOSIS — S66127D Laceration of flexor muscle, fascia and tendon of left little finger at wrist and hand level, subsequent encounter: Secondary | ICD-10-CM | POA: Diagnosis not present

## 2015-06-27 DIAGNOSIS — M6281 Muscle weakness (generalized): Secondary | ICD-10-CM | POA: Diagnosis not present

## 2015-06-27 DIAGNOSIS — M24542 Contracture, left hand: Secondary | ICD-10-CM | POA: Diagnosis not present

## 2015-06-27 DIAGNOSIS — L905 Scar conditions and fibrosis of skin: Secondary | ICD-10-CM | POA: Diagnosis not present

## 2015-07-11 ENCOUNTER — Ambulatory Visit
Admission: RE | Admit: 2015-07-11 | Discharge: 2015-07-11 | Disposition: A | Discharge status: Home / Self Care | Payer: 59 | Source: Ambulatory Visit

## 2015-07-11 DIAGNOSIS — Z1231 Encounter for screening mammogram for malignant neoplasm of breast: Secondary | ICD-10-CM | POA: Diagnosis not present

## 2015-08-03 MED FILL — ELINEST-28 TABLET: 0.3-30 | 84 days supply | Qty: 112 | Fill #3

## 2015-08-22 DIAGNOSIS — Z01419 Encounter for gynecological examination (general) (routine) without abnormal findings: Secondary | ICD-10-CM | POA: Diagnosis not present

## 2015-08-22 DIAGNOSIS — Z6827 Body mass index (BMI) 27.0-27.9, adult: Secondary | ICD-10-CM | POA: Diagnosis not present

## 2015-08-22 DIAGNOSIS — Z1151 Encounter for screening for human papillomavirus (HPV): Secondary | ICD-10-CM | POA: Diagnosis not present

## 2015-08-22 DIAGNOSIS — N939 Abnormal uterine and vaginal bleeding, unspecified: Secondary | ICD-10-CM | POA: Diagnosis not present

## 2015-08-25 MED FILL — NORETHIN-ESTRA-FE 0.8-0.025: 0.8-25 | 28 days supply | Qty: 28 | Fill #0

## 2015-09-19 MED FILL — NORETHIN-ESTRA-FE 0.8-0.025: 0.8-25 | 84 days supply | Qty: 84 | Fill #1

## 2015-11-13 MED FILL — IBUPROFEN 800 MG TABLET: 800 | 30 days supply | Qty: 90 | Fill #2

## 2015-12-15 MED FILL — NORETHIN-ESTRA-FE 0.8-0.025: 0.8-25 | 84 days supply | Qty: 84 | Fill #2

## 2016-01-02 MED FILL — MONTELUKAST SOD 10 MG TAB: 10 | 90 days supply | Qty: 90 | Fill #0

## 2016-01-15 MED FILL — ADVAIR 100/50 DISKUS: 100-50 | 90 days supply | Qty: 180 | Fill #0

## 2016-02-02 DIAGNOSIS — H5213 Myopia, bilateral: Secondary | ICD-10-CM | POA: Diagnosis not present

## 2016-02-21 DIAGNOSIS — R51 Headache: Secondary | ICD-10-CM | POA: Diagnosis not present

## 2016-02-21 DIAGNOSIS — N939 Abnormal uterine and vaginal bleeding, unspecified: Secondary | ICD-10-CM | POA: Diagnosis not present

## 2016-02-27 MED FILL — NORETHIN-ESTRA-FE 0.8-0.025: 0.8-25 | 84 days supply | Qty: 84 | Fill #3

## 2016-04-10 DIAGNOSIS — H1013 Acute atopic conjunctivitis, bilateral: Secondary | ICD-10-CM | POA: Diagnosis not present

## 2016-05-09 MED FILL — NORETHIN-ESTRA-FE 0.8-0.025: 0.8-25 | 84 days supply | Qty: 84 | Fill #4

## 2016-06-04 DIAGNOSIS — E663 Overweight: Secondary | ICD-10-CM | POA: Diagnosis not present

## 2016-06-04 DIAGNOSIS — Z6828 Body mass index (BMI) 28.0-28.9, adult: Secondary | ICD-10-CM | POA: Diagnosis not present

## 2016-06-04 DIAGNOSIS — J45909 Unspecified asthma, uncomplicated: Secondary | ICD-10-CM | POA: Diagnosis not present

## 2016-06-04 DIAGNOSIS — H539 Unspecified visual disturbance: Secondary | ICD-10-CM | POA: Diagnosis not present

## 2016-06-04 DIAGNOSIS — Z0000 Encounter for general adult medical examination without abnormal findings: Secondary | ICD-10-CM | POA: Diagnosis not present

## 2016-07-02 ENCOUNTER — Other Ambulatory Visit: Payer: Self-pay | Admitting: Internal Medicine

## 2016-07-02 DIAGNOSIS — Z1231 Encounter for screening mammogram for malignant neoplasm of breast: Secondary | ICD-10-CM

## 2016-07-19 ENCOUNTER — Ambulatory Visit
Admission: RE | Admit: 2016-07-19 | Discharge: 2016-07-19 | Disposition: A | Discharge status: Home / Self Care | Payer: 59 | Source: Ambulatory Visit | Attending: Internal Medicine | Admitting: Internal Medicine

## 2016-07-19 DIAGNOSIS — Z1231 Encounter for screening mammogram for malignant neoplasm of breast: Secondary | ICD-10-CM | POA: Diagnosis not present

## 2016-07-22 MED FILL — MONTELUKAST SOD 10 MG TAB: 10 | 90 days supply | Qty: 90 | Fill #1

## 2016-07-30 MED FILL — FLUTICASONE PROP 50 MCG SPR: 50 | 90 days supply | Qty: 48 | Fill #0

## 2016-07-30 MED FILL — ADVAIR 100/50 DISKUS: 100-50 | 90 days supply | Qty: 180 | Fill #0

## 2016-08-07 DIAGNOSIS — M542 Cervicalgia: Secondary | ICD-10-CM | POA: Diagnosis not present

## 2016-08-07 DIAGNOSIS — N62 Hypertrophy of breast: Secondary | ICD-10-CM | POA: Diagnosis not present

## 2016-08-07 DIAGNOSIS — M549 Dorsalgia, unspecified: Secondary | ICD-10-CM | POA: Diagnosis not present

## 2016-08-28 DIAGNOSIS — Z6826 Body mass index (BMI) 26.0-26.9, adult: Secondary | ICD-10-CM | POA: Diagnosis not present

## 2016-08-28 DIAGNOSIS — Z01419 Encounter for gynecological examination (general) (routine) without abnormal findings: Secondary | ICD-10-CM | POA: Diagnosis not present

## 2016-09-03 MED FILL — MONO-LINYAH 28 TABLET: 0.25-35 | 28 days supply | Qty: 28 | Fill #0

## 2016-09-25 MED FILL — MONO-LINYAH 28 TABLET: 0.25-35 | 28 days supply | Qty: 28 | Fill #1

## 2016-10-17 MED FILL — MONO-LINYAH 28 TABLET: 0.25-35 | 28 days supply | Qty: 28 | Fill #2

## 2016-10-21 MED FILL — MONTELUKAST SOD 10 MG TAB: 10 | 90 days supply | Qty: 90 | Fill #2

## 2016-10-22 ENCOUNTER — Telehealth: Payer: 59 | Admitting: Family

## 2016-10-22 DIAGNOSIS — J019 Acute sinusitis, unspecified: Secondary | ICD-10-CM

## 2016-10-22 DIAGNOSIS — B9689 Other specified bacterial agents as the cause of diseases classified elsewhere: Secondary | ICD-10-CM

## 2016-10-22 MED ORDER — AMOXICILLIN-POT CLAVULANATE 875-125 MG PO TABS: 1.0000 | ORAL_TABLET | Freq: Two times a day (BID) | ORAL | 0 refills | Status: DC

## 2016-10-22 MED FILL — AMOX-CLAV 875-125 MG TABLET: 875-125 | 7 days supply | Qty: 14 | Fill #0

## 2016-10-22 NOTE — Progress Notes (Signed)

## 2016-11-08 MED FILL — MONO-LINYAH 28 TABLET: 0.25-35 | 28 days supply | Qty: 28 | Fill #0

## 2016-11-29 MED FILL — MONO-LINYAH 28 TABLET: 0.25-35 | 84 days supply | Qty: 112 | Fill #0

## 2017-01-03 MED FILL — FLUTICASONE PROP 50 MCG SPR: 50 | 90 days supply | Qty: 48 | Fill #1

## 2017-01-27 MED FILL — MONTELUKAST SOD 10 MG TAB: 10 | 90 days supply | Qty: 90 | Fill #0

## 2017-02-07 DIAGNOSIS — H5213 Myopia, bilateral: Secondary | ICD-10-CM | POA: Diagnosis not present

## 2017-02-07 DIAGNOSIS — H5310 Unspecified subjective visual disturbances: Secondary | ICD-10-CM | POA: Diagnosis not present

## 2017-02-19 MED FILL — NORG-ETHIN ESTRA 0.25-0.035: 0.25-35 | 84 days supply | Qty: 112 | Fill #1

## 2017-02-26 DIAGNOSIS — M549 Dorsalgia, unspecified: Secondary | ICD-10-CM | POA: Diagnosis not present

## 2017-02-26 DIAGNOSIS — G8929 Other chronic pain: Secondary | ICD-10-CM | POA: Diagnosis not present

## 2017-02-26 DIAGNOSIS — N62 Hypertrophy of breast: Secondary | ICD-10-CM | POA: Diagnosis not present

## 2017-02-26 DIAGNOSIS — M542 Cervicalgia: Secondary | ICD-10-CM | POA: Diagnosis not present

## 2017-02-26 MED FILL — OXYCODONE W/APAP 5/325 TAB: 5-325 | 5 days supply | Qty: 20 | Fill #0

## 2017-02-26 NOTE — H&P (Signed)
Subjective:     Patient ID: Kathryn Cohen is a 41 y.o. female.  HPI  Here for follow up discussion prior to planned breast reduction. Current D cup. Notes several year history of neck and back pain. She has tried multiple modalities over the years including acupuncture, chiropractor, NSAIDs, Physical therapy (states her pain was worse with PT), TENS unit, regular activity including swimming, specialty bras. Notes rashes approximately twice per year that have not required antibiotics, she self manages with washing.    She declines any prescription medication for pain.  MMG 06/2016, benign  Patient known to me as her spouse has breast cancer and I have seen her in consultation. 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  Review of Systems Objective:   Physical Exam  Constitutional: She is oriented to person, place, and time.  Cardiovascular: Normal rate, regular rhythm and normal heart sounds.   Pulmonary/Chest: Effort normal and breath sounds normal.  Lymphadenopathy:    She has no axillary adenopathy.  Neurological: She is alert and oriented to person, place, and time.  Skin:  Fitzpatrick 2   + shoulder grooving bilateral +kyphosis No masses Grade 3 ptosis bilateral, right NAC at lowest point breast nipple pointing down SN to nipple R 32 L 30 cm BW R 18 L 16 cm Nipple to IMF R 14 L 16 cm    Assessment:     Macromastia Chronic neck and back pain    Plan:     Plan bilateral reduction with anchor type scars, possible overnight hospital stay,  drains, post operative visits and limitations, recovery. Diminished sensation nipple and breast skin, risk of nipple loss, wound healing problems, asymmetry, incidental carcinoma, changes with wt gain/loss, aging, unacceptable cosmetic appearance reviewed. Additional risks including but not limited to seroma, hematoma, DVT/PE, cardiopulmonary complications, damage to deeper structures, fat necrosis, need to additional procedures  reviewed. Discussed cannot assure her cup size.  Rx for Percocet given. Plan OP surgery.   Anticipate 325 g reduction from each breast.     Irene Limbo, MD [facility] [phone], pin [phone]

## 2017-02-27 HISTORY — PX: REDUCTION MAMMAPLASTY: SUR839

## 2017-03-11 ENCOUNTER — Other Ambulatory Visit: Payer: Self-pay

## 2017-03-11 ENCOUNTER — Encounter (HOSPITAL_BASED_OUTPATIENT_CLINIC_OR_DEPARTMENT_OTHER): Payer: Self-pay | Admitting: *Deleted

## 2017-03-12 NOTE — Progress Notes (Signed)
Ensure pre surgery drink given with instructions to complete by [place], pt verbalized understanding.

## 2017-03-13 MED FILL — ADVAIR 100/50 DISKUS: 100-50 | 90 days supply | Qty: 180 | Fill #1

## 2017-03-14 ENCOUNTER — Encounter (HOSPITAL_BASED_OUTPATIENT_CLINIC_OR_DEPARTMENT_OTHER): Payer: Self-pay | Admitting: *Deleted

## 2017-03-14 ENCOUNTER — Ambulatory Visit (HOSPITAL_BASED_OUTPATIENT_CLINIC_OR_DEPARTMENT_OTHER): Payer: 59 | Admitting: Anesthesiology

## 2017-03-14 ENCOUNTER — Other Ambulatory Visit: Payer: Self-pay

## 2017-03-14 ENCOUNTER — Ambulatory Visit (HOSPITAL_BASED_OUTPATIENT_CLINIC_OR_DEPARTMENT_OTHER)
Admission: RE | Admit: 2017-03-14 | Discharge: 2017-03-14 | Disposition: A | Discharge status: Home / Self Care | Payer: 59 | Source: Ambulatory Visit | Attending: Plastic Surgery | Admitting: Plastic Surgery

## 2017-03-14 ENCOUNTER — Encounter (HOSPITAL_BASED_OUTPATIENT_CLINIC_OR_DEPARTMENT_OTHER)
Admission: RE | Disposition: A | Discharge status: Home / Self Care | Payer: Self-pay | Source: Ambulatory Visit | Attending: Plastic Surgery

## 2017-03-14 DIAGNOSIS — M542 Cervicalgia: Secondary | ICD-10-CM | POA: Diagnosis not present

## 2017-03-14 DIAGNOSIS — N62 Hypertrophy of breast: Secondary | ICD-10-CM | POA: Insufficient documentation

## 2017-03-14 DIAGNOSIS — L304 Erythema intertrigo: Secondary | ICD-10-CM | POA: Diagnosis not present

## 2017-03-14 DIAGNOSIS — J45909 Unspecified asthma, uncomplicated: Secondary | ICD-10-CM | POA: Diagnosis not present

## 2017-03-14 DIAGNOSIS — Z79899 Other long term (current) drug therapy: Secondary | ICD-10-CM | POA: Insufficient documentation

## 2017-03-14 DIAGNOSIS — Z793 Long term (current) use of hormonal contraceptives: Secondary | ICD-10-CM | POA: Diagnosis not present

## 2017-03-14 DIAGNOSIS — Z7951 Long term (current) use of inhaled steroids: Secondary | ICD-10-CM | POA: Diagnosis not present

## 2017-03-14 DIAGNOSIS — M549 Dorsalgia, unspecified: Secondary | ICD-10-CM | POA: Diagnosis not present

## 2017-03-14 DIAGNOSIS — G8929 Other chronic pain: Secondary | ICD-10-CM | POA: Diagnosis not present

## 2017-03-14 HISTORY — PX: BREAST REDUCTION SURGERY: SHX8

## 2017-03-14 HISTORY — DX: Hypertrophy of breast: N62

## 2017-03-14 SURGERY — MAMMOPLASTY, REDUCTION
Anesthesia: General | Site: Breast | Laterality: Bilateral

## 2017-03-14 MED ORDER — LIDOCAINE 2% (20 MG/ML) 5 ML SYRINGE
INTRAMUSCULAR | Status: AC
  Filled 2017-03-14: qty 5

## 2017-03-14 MED ORDER — HEPARIN SODIUM (PORCINE) 5000 UNIT/ML IJ SOLN
5000.0000 [IU] | Freq: Once | INTRAMUSCULAR | Status: AC
  Administered 2017-03-14: 5000 [IU] via SUBCUTANEOUS

## 2017-03-14 MED ORDER — SCOPOLAMINE 1 MG/3DAYS TD PT72
MEDICATED_PATCH | TRANSDERMAL | Status: AC
  Filled 2017-03-14: qty 1

## 2017-03-14 MED ORDER — CELECOXIB 200 MG PO CAPS
200.0000 mg | ORAL_CAPSULE | ORAL | Status: AC
  Administered 2017-03-14: 200 mg via ORAL

## 2017-03-14 MED ORDER — LIDOCAINE HCL (PF) 1 % IJ SOLN
INTRAMUSCULAR | Status: AC
  Filled 2017-03-14: qty 30

## 2017-03-14 MED ORDER — CEFAZOLIN SODIUM-DEXTROSE 2-4 GM/100ML-% IV SOLN
INTRAVENOUS | Status: AC
  Filled 2017-03-14: qty 100

## 2017-03-14 MED ORDER — NEOSTIGMINE METHYLSULFATE 5 MG/5ML IV SOSY
PREFILLED_SYRINGE | INTRAVENOUS | Status: AC
  Filled 2017-03-14: qty 5

## 2017-03-14 MED ORDER — MIDAZOLAM HCL 2 MG/2ML IJ SOLN
1.0000 mg | INTRAMUSCULAR | Status: DC | PRN
  Administered 2017-03-14: 2 mg via INTRAVENOUS

## 2017-03-14 MED ORDER — SODIUM CHLORIDE 0.9 % IJ SOLN
INTRAMUSCULAR | Status: AC
  Filled 2017-03-14: qty 20

## 2017-03-14 MED ORDER — SODIUM CHLORIDE 0.9 % IV SOLN
INTRAVENOUS | Status: DC | PRN
  Administered 2017-03-14: 30 mL

## 2017-03-14 MED ORDER — ALBUMIN HUMAN 5 % IV SOLN
12.5000 g | Freq: Once | INTRAVENOUS | Status: AC
  Administered 2017-03-14: 09:00:00 via INTRAVENOUS

## 2017-03-14 MED ORDER — DEXAMETHASONE SODIUM PHOSPHATE 10 MG/ML IJ SOLN
INTRAMUSCULAR | Status: AC
  Filled 2017-03-14: qty 1

## 2017-03-14 MED ORDER — ONDANSETRON HCL 4 MG/2ML IJ SOLN
INTRAMUSCULAR | Status: DC | PRN
  Administered 2017-03-14: 4 mg via INTRAVENOUS

## 2017-03-14 MED ORDER — NEOSTIGMINE METHYLSULFATE 10 MG/10ML IV SOLN
INTRAVENOUS | Status: DC | PRN
  Administered 2017-03-14: 3 mg via INTRAVENOUS

## 2017-03-14 MED ORDER — CHLORHEXIDINE GLUCONATE CLOTH 2 % EX PADS: 6.0000 | MEDICATED_PAD | Freq: Once | CUTANEOUS | Status: DC

## 2017-03-14 MED ORDER — SCOPOLAMINE 1 MG/3DAYS TD PT72SCOPOLAMINE 1 MG/3DAYS
1.0000 | MEDICATED_PATCH | TRANSDERMAL | Status: DC
Start: 2017-03-14 — End: 2017-03-14
  Administered 2017-03-14: 1.5 mg via TRANSDERMAL

## 2017-03-14 MED ORDER — CELECOXIB 200 MG PO CAPS
ORAL_CAPSULE | ORAL | Status: AC
  Filled 2017-03-14: qty 1

## 2017-03-14 MED ORDER — ONDANSETRON HCL 4 MG/2ML IJ SOLN
INTRAMUSCULAR | Status: AC
  Filled 2017-03-14: qty 2

## 2017-03-14 MED ORDER — LIDOCAINE HCL (CARDIAC) 20 MG/ML IV SOLN
INTRAVENOUS | Status: DC | PRN
  Administered 2017-03-14: 100 mg via INTRAVENOUS

## 2017-03-14 MED ORDER — SCOPOLAMINE 1 MG/3DAYS TD PT72: 1.0000 | MEDICATED_PATCH | Freq: Once | TRANSDERMAL | Status: DC | PRN

## 2017-03-14 MED ORDER — HYDROMORPHONE HCL 1 MG/ML IJ SOLN
INTRAMUSCULAR | Status: AC
  Filled 2017-03-14: qty 0.5

## 2017-03-14 MED ORDER — FENTANYL CITRATE (PF) 100 MCG/2ML IJ SOLN
INTRAMUSCULAR | Status: AC
  Filled 2017-03-14: qty 2

## 2017-03-14 MED ORDER — PROPOFOL 10 MG/ML IV BOLUS
INTRAVENOUS | Status: AC
  Filled 2017-03-14: qty 40

## 2017-03-14 MED ORDER — HYDROMORPHONE HCL 1 MG/ML IJ SOLN
0.2500 mg | INTRAMUSCULAR | Status: DC | PRN
  Administered 2017-03-14: 0.5 mg via INTRAVENOUS

## 2017-03-14 MED ORDER — MIDAZOLAM HCL 2 MG/2ML IJ SOLN
INTRAMUSCULAR | Status: AC
  Filled 2017-03-14: qty 2

## 2017-03-14 MED ORDER — DEXAMETHASONE SODIUM PHOSPHATE 4 MG/ML IJ SOLN
INTRAMUSCULAR | Status: DC | PRN
  Administered 2017-03-14: 10 mg via INTRAVENOUS

## 2017-03-14 MED ORDER — FENTANYL CITRATE (PF) 100 MCG/2ML IJ SOLN
50.0000 ug | INTRAMUSCULAR | Status: DC | PRN
  Administered 2017-03-14: 100 ug via INTRAVENOUS

## 2017-03-14 MED ORDER — ALBUMIN HUMAN 5 % IV SOLN
INTRAVENOUS | Status: AC
  Filled 2017-03-14: qty 250

## 2017-03-14 MED ORDER — SUFENTANIL CITRATE 50 MCG/ML IV SOLN
INTRAVENOUS | Status: AC
  Filled 2017-03-14: qty 1

## 2017-03-14 MED ORDER — ACETAMINOPHEN 500 MG PO TABS
1000.0000 mg | ORAL_TABLET | ORAL | Status: AC
  Administered 2017-03-14: 1000 mg via ORAL

## 2017-03-14 MED ORDER — ACETAMINOPHEN 500 MG PO TABS
ORAL_TABLET | ORAL | Status: AC
Start: 2017-03-14 — End: ?
  Filled 2017-03-14: qty 2

## 2017-03-14 MED ORDER — ROCURONIUM BROMIDE 10 MG/ML (PF) SYRINGE
PREFILLED_SYRINGE | INTRAVENOUS | Status: AC
  Filled 2017-03-14: qty 5

## 2017-03-14 MED ORDER — GLYCOPYRROLATE 0.2 MG/ML IJ SOLN
INTRAMUSCULAR | Status: DC | PRN
  Administered 2017-03-14: 0.2 mg via INTRAVENOUS
  Administered 2017-03-14: 0.4 mg via INTRAVENOUS

## 2017-03-14 MED ORDER — GABAPENTIN 300 MG PO CAPS
300.0000 mg | ORAL_CAPSULE | ORAL | Status: AC
  Administered 2017-03-14: 300 mg via ORAL

## 2017-03-14 MED ORDER — BUPIVACAINE HCL (PF) 0.25 % IJ SOLN
INTRAMUSCULAR | Status: AC
  Filled 2017-03-14: qty 60

## 2017-03-14 MED ORDER — PROMETHAZINE HCL 25 MG/ML IJ SOLN: 6.2500 mg | INTRAMUSCULAR | Status: DC | PRN

## 2017-03-14 MED ORDER — GABAPENTIN 300 MG PO CAPS
ORAL_CAPSULE | ORAL | Status: AC
  Filled 2017-03-14: qty 1

## 2017-03-14 MED ORDER — HEPARIN SODIUM (PORCINE) 5000 UNIT/ML IJ SOLN
INTRAMUSCULAR | Status: AC
  Filled 2017-03-14: qty 1

## 2017-03-14 MED ORDER — PHENYLEPHRINE HCL 10 MG/ML IJ SOLN
INTRAMUSCULAR | Status: DC | PRN
  Administered 2017-03-14: 80 ug via INTRAVENOUS
  Administered 2017-03-14: 120 ug via INTRAVENOUS
  Administered 2017-03-14: 80 ug via INTRAVENOUS

## 2017-03-14 MED ORDER — PROPOFOL 10 MG/ML IV BOLUS
INTRAVENOUS | Status: DC | PRN
  Administered 2017-03-14: 200 mg via INTRAVENOUS

## 2017-03-14 MED ORDER — SUFENTANIL 5MCG/ML (10ML) SYRINGE FOR MEDFUSION PUMP - OPTIME
INTRAVENOUS | Status: DC | PRN
  Administered 2017-03-14 (×2): 5 ug via INTRAVENOUS

## 2017-03-14 MED ORDER — ROCURONIUM BROMIDE 100 MG/10ML IV SOLN
INTRAVENOUS | Status: DC | PRN
  Administered 2017-03-14: 70 mg via INTRAVENOUS

## 2017-03-14 MED ORDER — BUPIVACAINE LIPOSOME 1.3 % IJ SUSP
INTRAMUSCULAR | Status: AC
  Filled 2017-03-14: qty 20

## 2017-03-14 MED ORDER — LACTATED RINGERS IV SOLN
INTRAVENOUS | Status: DC
  Administered 2017-03-14 (×2): via INTRAVENOUS

## 2017-03-14 MED ORDER — CEFAZOLIN SODIUM-DEXTROSE 2-4 GM/100ML-% IV SOLN
2.0000 g | INTRAVENOUS | Status: AC
  Administered 2017-03-14: 2 g via INTRAVENOUS

## 2017-03-14 MED ORDER — ALBUTEROL SULFATE HFA 108 (90 BASE) MCG/ACT IN AERS
INHALATION_SPRAY | RESPIRATORY_TRACT | Status: AC
  Filled 2017-03-14: qty 6.7

## 2017-03-14 SURGICAL SUPPLY — 54 items
APPLIER CLIP 9.375 MED OPEN (MISCELLANEOUS)
BINDER BREAST 3XL (GAUZE/BANDAGES/DRESSINGS) IMPLANT
BINDER BREAST LRG (GAUZE/BANDAGES/DRESSINGS) ×2 IMPLANT
BINDER BREAST MEDIUM (GAUZE/BANDAGES/DRESSINGS) IMPLANT
BINDER BREAST XLRG (GAUZE/BANDAGES/DRESSINGS) IMPLANT
BINDER BREAST XXLRG (GAUZE/BANDAGES/DRESSINGS) IMPLANT
BLADE SURG 10 STRL SS (BLADE) ×8 IMPLANT
BNDG GAUZE ELAST 4 BULKY (GAUZE/BANDAGES/DRESSINGS) ×4 IMPLANT
CANISTER SUCT 1200ML W/VALVE (MISCELLANEOUS) ×2 IMPLANT
CHLORAPREP W/TINT 26ML (MISCELLANEOUS) ×4 IMPLANT
CLIP APPLIE 9.375 MED OPEN (MISCELLANEOUS) IMPLANT
CLIP VESOCCLUDE MED 6/CT (CLIP) IMPLANT
COVER BACK TABLE 60X90IN (DRAPES) ×2 IMPLANT
COVER MAYO STAND STRL (DRAPES) ×2 IMPLANT
DERMABOND ADVANCED (GAUZE/BANDAGES/DRESSINGS) ×1
DERMABOND ADVANCED .7 DNX12 (GAUZE/BANDAGES/DRESSINGS) ×1 IMPLANT
DRAIN CHANNEL 15F RND FF W/TCR (WOUND CARE) ×4 IMPLANT
DRAPE TOP ARMCOVERS (MISCELLANEOUS) ×2 IMPLANT
DRAPE U-SHAPE 76X120 STRL (DRAPES) ×2 IMPLANT
DRAPE UTILITY XL STRL (DRAPES) IMPLANT
DRSG PAD ABDOMINAL 8X10 ST (GAUZE/BANDAGES/DRESSINGS) ×4 IMPLANT
ELECT COATED BLADE 2.86 ST (ELECTRODE) ×2 IMPLANT
ELECT REM PT RETURN 9FT ADLT (ELECTROSURGICAL) ×2
ELECTRODE REM PT RTRN 9FT ADLT (ELECTROSURGICAL) ×1 IMPLANT
EVACUATOR SILICONE 100CC (DRAIN) ×4 IMPLANT
GLOVE BIO SURGEON STRL SZ 6 (GLOVE) ×4 IMPLANT
GLOVE SURG SS PI 6.0 STRL IVOR (GLOVE) ×8 IMPLANT
GLOVE SURG SS PI 6.5 STRL IVOR (GLOVE) ×8 IMPLANT
GOWN STRL REUS W/ TWL LRG LVL3 (GOWN DISPOSABLE) ×3 IMPLANT
GOWN STRL REUS W/TWL LRG LVL3 (GOWN DISPOSABLE) ×3
MARKER SKIN DUAL TIP RULER LAB (MISCELLANEOUS) IMPLANT
NEEDLE HYPO 25X1 1.5 SAFETY (NEEDLE) IMPLANT
NS IRRIG 1000ML POUR BTL (IV SOLUTION) ×2 IMPLANT
PACK BASIN DAY SURGERY FS (CUSTOM PROCEDURE TRAY) ×2 IMPLANT
PENCIL BUTTON HOLSTER BLD 10FT (ELECTRODE) ×2 IMPLANT
PIN SAFETY STERILE (MISCELLANEOUS) ×2 IMPLANT
SHEET MEDIUM DRAPE 40X70 STRL (DRAPES) ×4 IMPLANT
SLEEVE SCD COMPRESS KNEE MED (MISCELLANEOUS) ×2 IMPLANT
SPONGE LAP 18X18 X RAY DECT (DISPOSABLE) ×8 IMPLANT
STAPLER VISISTAT 35W (STAPLE) ×4 IMPLANT
SUT ETHILON 2 0 FS 18 (SUTURE) ×4 IMPLANT
SUT MNCRL AB 4-0 PS2 18 (SUTURE) ×8 IMPLANT
SUT PDS AB 2-0 CT2 27 (SUTURE) IMPLANT
SUT VIC AB 3-0 PS1 18 (SUTURE) ×4
SUT VIC AB 3-0 PS1 18XBRD (SUTURE) ×4 IMPLANT
SUT VICRYL 4-0 PS2 18IN ABS (SUTURE) ×4 IMPLANT
SYR BULB IRRIGATION 50ML (SYRINGE) ×2 IMPLANT
SYR CONTROL 10ML LL (SYRINGE) ×2 IMPLANT
TAPE MEASURE VINYL STERILE (MISCELLANEOUS) IMPLANT
TOWEL OR 17X24 6PK STRL BLUE (TOWEL DISPOSABLE) ×4 IMPLANT
TRAY FOLEY BAG SILVER LF 14FR (SET/KITS/TRAYS/PACK) IMPLANT
TUBE CONNECTING 20X1/4 (TUBING) ×2 IMPLANT
UNDERPAD 30X30 (UNDERPADS AND DIAPERS) ×4 IMPLANT
YANKAUER SUCT BULB TIP NO VENT (SUCTIONS) ×2 IMPLANT

## 2017-03-14 NOTE — Interval H&P Note (Signed)
History and Physical Interval Note:  03/14/2017 6:48 AM  Kathryn Cohen  has presented today for surgery, with the diagnosis of BREAST HYPERTROPHY  The various methods of treatment have been discussed with the patient and family. After consideration of risks, benefits and other options for treatment, the patient has consented to  Procedure(s): BILATERAL MAMMARY REDUCTION  (BREAST) (Bilateral) as a surgical intervention .  The patient's history has been reviewed, patient examined, no change in status, stable for surgery.  I have reviewed the patient's chart and labs.  Questions were answered to the patient's satisfaction.     Treshaun Carrico

## 2017-03-14 NOTE — Anesthesia Preprocedure Evaluation (Addendum)
Anesthesia Evaluation  Patient identified by MRN, date of birth, ID band Patient awake    Reviewed: Allergy & Precautions, NPO status , Patient's Chart, lab work & pertinent test results  History of Anesthesia Complications Negative for: history of anesthetic complications  Airway Mallampati: II  TM Distance: >3 FB     Dental no notable dental hx. (+) Dental Advisory Given   Pulmonary neg pulmonary ROS, asthma ,    Pulmonary exam normal        Cardiovascular negative cardio ROS Normal cardiovascular exam     Neuro/Psych negative neurological ROS  negative psych ROS   GI/Hepatic negative GI ROS, Neg liver ROS,   Endo/Other  negative endocrine ROS  Renal/GU negative Renal ROS  negative genitourinary   Musculoskeletal negative musculoskeletal ROS (+)   Abdominal   Peds negative pediatric ROS (+)  Hematology negative hematology ROS (+)   Anesthesia Other Findings   Reproductive/Obstetrics negative OB ROS                            Anesthesia Physical Anesthesia Plan  ASA: II  Anesthesia Plan: General   Post-op Pain Management:    Induction: Intravenous  PONV Risk Score and Plan: 4 or greater and Ondansetron, Dexamethasone, Scopolamine patch - Pre-op and Diphenhydramine  Airway Management Planned: Oral ETT  Additional Equipment:   Intra-op Plan:   Post-operative Plan: Extubation in OR  Informed Consent: I have reviewed the patients History and Physical, chart, labs and discussed the procedure including the risks, benefits and alternatives for the proposed anesthesia with the patient or authorized representative who has indicated his/her understanding and acceptance.   Dental advisory given  Plan Discussed with: CRNA, Anesthesiologist and Surgeon  Anesthesia Plan Comments:        Anesthesia Quick Evaluation

## 2017-03-14 NOTE — Anesthesia Postprocedure Evaluation (Signed)
Anesthesia Post Note  Patient: Kathryn Cohen  Procedure(s) Performed: BILATERAL MAMMARY REDUCTION  (BREAST) (Bilateral Breast)     Patient location during evaluation: PACU Anesthesia Type: General Level of consciousness: sedated Pain management: pain level controlled Vital Signs Assessment: post-procedure vital signs reviewed and stable Respiratory status: spontaneous breathing and respiratory function stable Cardiovascular status: stable Postop Assessment: no apparent nausea or vomiting Anesthetic complications: no    Last Vitals:  Vitals:   03/14/17 1115 03/14/17 1130  BP: 123/69 123/65  Pulse: (!) 53 (!) 55  Resp: 10 16  Temp:    SpO2: 100% 99%    Last Pain:  Vitals:   03/14/17 1130  TempSrc:   PainSc: 2                  Jeffery Gammell DANIEL

## 2017-03-14 NOTE — Transfer of Care (Signed)
Immediate Anesthesia Transfer of Care Note  Patient: Kathryn Cohen  Procedure(s) Performed: BILATERAL MAMMARY REDUCTION  (BREAST) (Bilateral Breast)  Patient Location: PACU  Anesthesia Type:General  Level of Consciousness: awake, alert  and oriented  Airway & Oxygen Therapy: Patient Spontanous Breathing and Patient connected to face mask oxygen  Post-op Assessment: Report given to RN and Post -op Vital signs reviewed and stable  Post vital signs: Reviewed and stable  Last Vitals:  Vitals:   03/14/17 0711  BP: 115/67  Pulse: 63  Resp: 18  Temp: 36.5 C  SpO2: 100%    Last Pain:  Vitals:   03/14/17 0711  TempSrc: Oral         Complications: No apparent anesthesia complications

## 2017-03-14 NOTE — Op Note (Signed)
Operative Note   DATE OF OPERATION: 11.16.18  LOCATION: [facility]-outpatient  SURGICAL DIVISION: Plastic Surgery  PREOPERATIVE DIAGNOSES:  1. Macromastia 2. Chronic neck and back pain 3. intertrigo  POSTOPERATIVE DIAGNOSES:  same  PROCEDURE:  Bilateral breast reduction  SURGEON: Irene Limbo MD MBA  ASSISTANT: S Rayburn PA-C  ANESTHESIA:  General.   EBL: 993 ml  COMPLICATIONS: None immediate.   INDICATIONS FOR PROCEDURE:  The patient, Kathryn Cohen, is a 41 y.o. female born on [birth year], is here for breast reduction for treatment of chronic neck in back pain in setting of macromastia.   FINDINGS: Right reduction 511 g Left reduction 361 g  DESCRIPTION OF PROCEDURE:  The patient was marked standing in the preoperative area to mark sternal notch, chest midline, anterior axillary lines, inframammary folds. The location of new nipple areolar complex was marked at level of on inframammary fold on anterior surface breast by palpation. This was marked symmetric over bilateral breasts. With aid of Wise pattern marker, location of new nipple areolar complex and vertical limbs (8 cm) were marked. The patient was taken to the operating room. SCDs were placed and IV antibiotics were given. The patient's operative site was prepped and draped in a sterile fashion. A time out was performed and all information was confirmed to be correct.   Over left breast, superomedial pedicle designed. 42 mm areola marker used to mark nipple areola complex. The pedicle was deepithelialized and developed toward chest wall until tension free closure obtained. Lower pole tissue present excised including anterior surface lumpectomy cavity. The cavity irrigated and hemostasis obtained. Breast tailor tacked closed.  In then directed attention to right breast. Superomedial pedicle marked and nipple areolar complex marked with 42 mm diameter marker. Pedicle deepithlialized and developedto chest wall.  Breast tissue resected over lower pole and from location planned NAC inset. Breast tailor tacked closed, and patient brought to upright sitting position and assessed for symmetry. Patent returned to supine position and breast cavities irrigated and hemostasis obtained. Exparel infiltrated throughout each breast. 15 Fr JP placed in each breast and secured with 2-0 nylon. Closure completed with 3-0 vicryl to approximate dermis along inframammary fold and vertical limb. NAC inset with 4-0 vicryl in dermis. Skin closure completed with 4-0 monocryl subcuticular throughout.Tissue adhesive applied. Dry dressing and breast binder applied. A similar closure performed on each side.  The patient was allowed to wake from anesthesia, extubated and taken to the recovery room in satisfactory condition.   SPECIMENS: right and left breast tissue  DRAINS: 15 Fr JP in right and left breast  Irene Limbo, MD [facility] [phone], pin [phone]

## 2017-03-14 NOTE — Anesthesia Procedure Notes (Signed)
Procedure Name: Intubation Performed by: Verita Lamb, CRNA Pre-anesthesia Checklist: Patient identified, Emergency Drugs available, Suction available, Patient being monitored and Timeout performed Preoxygenation: Pre-oxygenation with 100% oxygen Induction Type: IV induction Ventilation: Mask ventilation without difficulty Laryngoscope Size: Mac and 3 Grade View: Grade I Tube type: Oral Tube size: 6.0 mm Number of attempts: 1 Airway Equipment and Method: Stylet Placement Confirmation: ETT inserted through vocal cords under direct vision,  positive ETCO2,  CO2 detector and breath sounds checked- equal and bilateral Secured at: 19 cm Tube secured with: Tape Dental Injury: Teeth and Oropharynx as per pre-operative assessment

## 2017-03-14 NOTE — Discharge Instructions (Signed)
Post Anesthesia Home Care Instructions  Activity: Get plenty of rest for the remainder of the day. A responsible individual must stay with you for 24 hours following the procedure.  For the next 24 hours, DO NOT: -Drive a car -Paediatric nurse -Drink alcoholic beverages -Take any medication unless instructed by your physician -Make any legal decisions or sign important papers.  Meals: Start with liquid foods such as gelatin or soup. Progress to regular foods as tolerated. Avoid greasy, spicy, heavy foods. If nausea and/or vomiting occur, drink only clear liquids until the nausea and/or vomiting subsides. Call your physician if vomiting continues.  Special Instructions/Symptoms: Your throat may feel dry or sore from the anesthesia or the breathing tube placed in your throat during surgery. If this causes discomfort, gargle with warm salt water. The discomfort should disappear within 24 hours.  If you had a scopolamine patch placed behind your ear for the management of post- operative nausea and/or vomiting:  1. The medication in the patch is effective for 72 hours, after which it should be removed.  Wrap patch in a tissue and discard in the trash. Wash hands thoroughly with soap and water. 2. You may remove the patch earlier than 72 hours if you experience unpleasant side effects which may include dry mouth, dizziness or visual disturbances. 3. Avoid touching the patch. Wash your hands with soap and water after contact with the patch.   SACRAL DRESSING (Lower Back)   A pressure ulcer is a sore where the skin breaks open   This dressing will be placed on your lower back to protect this area from pressure and moisture and in many cases helps prevent pressure ulcers from forming   A nurse may place this dressing before your surgery or another procedure   A nurse may also place this dressing if you have other conditions that put you at risk for developing a pressure  ulcer   If you are getting up and moving around after surgery, the dressing may be taken off with your first shower. Simply remove it and throw it away.   While you are in the hospital, nurses will change the dressing twice a week as long as you are still at risk for developing a pressure ulcer   This dressing is latex free and made with silicone (for adhesive sensitivity) so it is safe and gentle to the skin  About my Jackson-Pratt Bulb Drain  What is a Jackson-Pratt bulb? A Jackson-Pratt is a soft, round device used to collect drainage. It is connected to a long, thin drainage catheter, which is held in place by one or two small stiches near your surgical incision site. When the bulb is squeezed, it forms a vacuum, forcing the drainage to empty into the bulb.  Emptying the Jackson-Pratt bulb- To empty the bulb: 1. Release the plug on the top of the bulb. 2. Pour the bulb's contents into a measuring container which your nurse will provide. 3. Record the time emptied and amount of drainage. Empty the drain(s) as often as your     doctor or nurse recommends.  Date                  Time                    Amount (Drain 1)                 Amount (Drain 2)  _____________________________________________________________________  _____________________________________________________________________  _____________________________________________________________________  _____________________________________________________________________  _____________________________________________________________________  _____________________________________________________________________  _____________________________________________________________________  _____________________________________________________________________  Squeezing the Jackson-Pratt Bulb- To squeeze the bulb: 1. Make sure the plug at the top of the bulb is open. 2. Squeeze the bulb tightly in your fist. You will hear  air squeezing from the bulb. 3. Replace the plug while the bulb is squeezed. 4. Use a safety pin to attach the bulb to your clothing. This will keep the catheter from     pulling at the bulb insertion site.  When to call your doctor- Call your doctor if:  Drain site becomes red, swollen or hot.  You have a fever greater than 101 degrees F.  There is oozing at the drain site.  Drain falls out (apply a guaze bandage over the drain hole and secure it with tape).  Drainage increases daily not related to activity patterns. (You will usually have more drainage when you are active than when you are resting.)  Drainage has a bad odor.   Information for Discharge Teaching: EXPAREL (bupivacaine liposome injectable suspension)   Your surgeon gave you EXPAREL(bupivacaine) in your surgical incision to help control your pain after surgery.   EXPAREL is a local anesthetic that provides pain relief by numbing the tissue around the surgical site.  EXPAREL is designed to release pain medication over time and can control pain for up to 72 hours.  Depending on how you respond to EXPAREL, you may require less pain medication during your recovery.  Possible side effects:  Temporary loss of sensation or ability to move in the area where bupivacaine was injected.  Nausea, vomiting, constipation  Rarely, numbness and tingling in your mouth or lips, lightheadedness, or anxiety may occur.  Call your doctor right away if you think you may be experiencing any of these sensations, or if you have other questions regarding possible side effects.  Follow all other discharge instructions given to you by your surgeon or nurse. Eat a healthy diet and drink plenty of water or other fluids.  If you return to the hospital for any reason within 96 hours following the administration of EXPAREL, please inform your health care providers.

## 2017-03-17 ENCOUNTER — Encounter (HOSPITAL_BASED_OUTPATIENT_CLINIC_OR_DEPARTMENT_OTHER): Payer: Self-pay | Admitting: Plastic Surgery

## 2017-03-17 MED FILL — IBUPROFEN 800 MG TABS: 800 | 15 days supply | Qty: 30 | Fill #0

## 2017-05-19 MED FILL — FEMYNOR 0.25-35 MG-MCG TABS: 0.25-35 | 84 days supply | Qty: 112 | Fill #2

## 2017-06-26 MED FILL — MONTELUKAST SOD 10 MG TAB: 10 | 90 days supply | Qty: 90 | Fill #1

## 2017-07-28 MED FILL — FLUTICASONE PROP 50 MCG SPR: 50 | 90 days supply | Qty: 48 | Fill #2

## 2017-08-06 MED FILL — PREVIFEM 0.25-35 MG-MCG TAB: 0.25-35 | 84 days supply | Qty: 112 | Fill #3

## 2017-09-12 ENCOUNTER — Other Ambulatory Visit: Payer: Self-pay | Admitting: Internal Medicine

## 2017-09-12 DIAGNOSIS — Z1231 Encounter for screening mammogram for malignant neoplasm of breast: Secondary | ICD-10-CM

## 2017-09-25 MED FILL — MONTELUKAST SOD 10 MG TAB: 10 | 90 days supply | Qty: 90 | Fill #2

## 2017-09-29 MED FILL — ADVAIR 100/50 DISKUS: 100-50 | 90 days supply | Qty: 180 | Fill #0

## 2017-10-10 ENCOUNTER — Ambulatory Visit
Admission: RE | Admit: 2017-10-10 | Discharge: 2017-10-10 | Disposition: A | Discharge status: Home / Self Care | Payer: No Typology Code available for payment source | Source: Ambulatory Visit | Attending: Internal Medicine | Admitting: Internal Medicine

## 2017-10-10 DIAGNOSIS — Z1231 Encounter for screening mammogram for malignant neoplasm of breast: Secondary | ICD-10-CM

## 2017-10-31 MED FILL — FEMYNOR 0.25-35 MG-MCG TABS: 0.25-35 | 84 days supply | Qty: 84 | Fill #0

## 2017-12-30 MED FILL — MONTELUKAST SOD 10 MG TAB: 10 | 90 days supply | Qty: 90 | Fill #3

## 2018-01-02 MED FILL — FEMYNOR 0.25-35 MG-MCG TABS: 0.25-35 | 21 days supply | Qty: 28 | Fill #1

## 2018-02-09 MED FILL — FEMYNOR 0.25-35 MG-MCG TABS: 0.25-35 | 84 days supply | Qty: 112 | Fill #0

## 2018-05-06 MED FILL — FEMYNOR 0.25-35 MG-MCG TABS: 0.25-35 | 84 days supply | Qty: 112 | Fill #1

## 2018-06-24 MED FILL — ADVAIR 100/50 DISKUS: 100-50 | 90 days supply | Qty: 180 | Fill #1

## 2018-06-24 MED FILL — MONTELUKAST SOD 10 MG TAB: 10 | 90 days supply | Qty: 90 | Fill #0 | Status: TO

## 2018-07-16 MED FILL — FEMYNOR 0.25-35 MG-MCG TABS: 0.25-35 | 84 days supply | Qty: 112 | Fill #2

## 2018-09-16 MED FILL — MONTELUKAST SOD 10 MG TAB: 10 | 90 days supply | Qty: 90 | Fill #0

## 2018-10-07 MED FILL — KAITLIB FE CHEWABLE TABLET: 0.8-25 | 84 days supply | Qty: 112 | Fill #0

## 2019-01-01 MED FILL — MONTELUKAST SOD 10 MG TAB: 10 | 90 days supply | Qty: 90 | Fill #0

## 2019-01-07 MED FILL — ADVAIR 100/50 DISKUS: 100-50 | 90 days supply | Qty: 180 | Fill #0

## 2019-01-07 MED FILL — ALBUTEROL SULFATE HFA 108 (: 108 (90 BAS | 50 days supply | Qty: 54 | Fill #0

## 2019-01-07 MED FILL — FLUTICASONE PROP 50 MCG SPR: 50 | 90 days supply | Qty: 48 | Fill #0

## 2019-01-11 MED FILL — MONTELUKAST SOD 10 MG TAB: 10 | 90 days supply | Qty: 90 | Fill #0

## 2019-01-27 MED FILL — KAITLIB FE CHEWABLE TABLET: 0.8-25 | 84 days supply | Qty: 112 | Fill #1

## 2019-05-03 MED FILL — MONTELUKAST SOD 10 MG TAB: 10 | 90 days supply | Qty: 90 | Fill #1

## 2019-05-03 MED FILL — NORETHIN-ESTRA-FE 0.8-0.025: 0.8-25 | 84 days supply | Qty: 112 | Fill #0

## 2019-06-23 ENCOUNTER — Other Ambulatory Visit: Payer: Self-pay | Admitting: Internal Medicine

## 2019-06-23 DIAGNOSIS — Z1231 Encounter for screening mammogram for malignant neoplasm of breast: Secondary | ICD-10-CM

## 2019-07-09 ENCOUNTER — Ambulatory Visit: Payer: No Typology Code available for payment source | Attending: Internal Medicine

## 2019-07-09 DIAGNOSIS — Z23 Encounter for immunization: Secondary | ICD-10-CM

## 2019-07-09 NOTE — Progress Notes (Signed)
   Covid-19 Vaccination Clinic  Name:  Jenalee Trevizo    MRN: [id number] DOB: Sep 26, 1975  07/09/2019  Ms. Bry was observed post Covid-19 immunization for 15 minutes without incident. She was provided with Vaccine Information Sheet and instruction to access the V-Safe system.   Ms. Bacot was instructed to call 911 with any severe reactions post vaccine: Marland Kitchen Difficulty breathing  . Swelling of face and throat  . A fast heartbeat  . A bad rash all over body  . Dizziness and weakness   Immunizations Administered    Name Date Dose VIS Date Route   Pfizer COVID-19 Vaccine 07/09/2019  8:34 AM 0.3 mL 04/09/2019 Intramuscular   Manufacturer: [place]   Lot: [id number]   [place]: [id number]

## 2019-07-23 ENCOUNTER — Other Ambulatory Visit: Payer: Self-pay

## 2019-07-23 ENCOUNTER — Ambulatory Visit
Admission: RE | Admit: 2019-07-23 | Discharge: 2019-07-23 | Disposition: A | Discharge status: Home / Self Care | Payer: No Typology Code available for payment source | Source: Ambulatory Visit | Attending: Internal Medicine | Admitting: Internal Medicine

## 2019-07-23 DIAGNOSIS — Z1231 Encounter for screening mammogram for malignant neoplasm of breast: Secondary | ICD-10-CM

## 2019-07-27 ENCOUNTER — Other Ambulatory Visit: Payer: Self-pay | Admitting: Internal Medicine

## 2019-07-27 DIAGNOSIS — R928 Other abnormal and inconclusive findings on diagnostic imaging of breast: Secondary | ICD-10-CM

## 2019-07-30 ENCOUNTER — Ambulatory Visit
Admission: RE | Admit: 2019-07-30 | Discharge: 2019-07-30 | Disposition: A | Discharge status: Home / Self Care | Payer: No Typology Code available for payment source | Source: Ambulatory Visit | Attending: Internal Medicine | Admitting: Internal Medicine

## 2019-07-30 ENCOUNTER — Other Ambulatory Visit: Payer: Self-pay

## 2019-07-30 DIAGNOSIS — R928 Other abnormal and inconclusive findings on diagnostic imaging of breast: Secondary | ICD-10-CM

## 2019-08-02 ENCOUNTER — Ambulatory Visit: Payer: No Typology Code available for payment source | Attending: Internal Medicine

## 2019-08-02 DIAGNOSIS — Z23 Encounter for immunization: Secondary | ICD-10-CM

## 2019-08-02 NOTE — Progress Notes (Signed)
   Covid-19 Vaccination Clinic  Name:  Kathryn Cohen    MRN: [id number] DOB: [birth year]  08/02/2019  [name] was observed post Covid-19 immunization for 15 minutes without incident. She was provided with Vaccine Information Sheet and instruction to access the V-Safe system.   [name] was instructed to call 911 with any severe reactions post vaccine: Marland Kitchen Difficulty breathing  . Swelling of face and throat  . A fast heartbeat  . A bad rash all over body  . Dizziness and weakness   Immunizations Administered    Name Date Dose VIS Date Route   Pfizer COVID-19 Vaccine 08/02/2019 11:30 AM 0.3 mL 04/09/2019 Intramuscular   Manufacturer: [place]   Lot: [id number]   [place]: [id number]

## 2019-08-05 MED FILL — NORETHIN-ESTRA-FE 0.8-0.025: 0.8-25 | 84 days supply | Qty: 112 | Fill #1

## 2019-08-05 MED FILL — MONTELUKAST SOD 10 MG TAB: 10 | 90 days supply | Qty: 90 | Fill #0

## 2019-09-09 ENCOUNTER — Other Ambulatory Visit (HOSPITAL_COMMUNITY): Payer: Self-pay | Admitting: Obstetrics and Gynecology

## 2019-10-18 MED FILL — ADVAIR 100/50 DISKUS: 100-50 | 90 days supply | Qty: 180 | Fill #1

## 2019-10-18 MED FILL — FLUTICASONE PROP 50 MCG SPR: 50 | 90 days supply | Qty: 48 | Fill #1

## 2019-11-15 MED FILL — MONTELUKAST SOD 10 MG TAB: 10 | 90 days supply | Qty: 90 | Fill #1

## 2019-11-15 MED FILL — NORETHIN-ESTRA-FE 0.8-0.025: 0.8-25 | 84 days supply | Qty: 112 | Fill #0

## 2020-02-14 ENCOUNTER — Other Ambulatory Visit (HOSPITAL_COMMUNITY): Payer: Self-pay | Admitting: Internal Medicine

## 2020-02-14 MED FILL — MONTELUKAST SOD 10 MG TAB: 10 | 90 days supply | Qty: 90 | Fill #0

## 2020-02-14 MED FILL — NORETHIN-ESTRA-FE 0.8-0.025: 0.8-25 | 84 days supply | Qty: 112 | Fill #1

## 2020-04-10 ENCOUNTER — Other Ambulatory Visit (HOSPITAL_COMMUNITY): Payer: Self-pay | Admitting: Internal Medicine

## 2020-04-10 MED FILL — ADVAIR 100/50 DISKUS: 100-50 | 90 days supply | Qty: 180 | Fill #0

## 2020-05-23 MED FILL — NORETHIN-ESTRA-FE 0.8-0.025: 0.8-25 | 84 days supply | Qty: 112 | Fill #2

## 2020-06-13 ENCOUNTER — Other Ambulatory Visit (HOSPITAL_COMMUNITY): Payer: Self-pay | Admitting: Internal Medicine

## 2020-06-13 MED FILL — FLUTICASONE PROP 50 MCG SPR: 50 | 90 days supply | Qty: 48 | Fill #0

## 2020-06-27 MED FILL — MONTELUKAST SOD 10 MG TAB: 10 | 90 days supply | Qty: 90 | Fill #1

## 2020-07-04 ENCOUNTER — Other Ambulatory Visit: Payer: Self-pay | Admitting: Internal Medicine

## 2020-07-04 DIAGNOSIS — Z1231 Encounter for screening mammogram for malignant neoplasm of breast: Secondary | ICD-10-CM

## 2020-08-23 ENCOUNTER — Other Ambulatory Visit (HOSPITAL_COMMUNITY): Payer: Self-pay

## 2020-08-23 MED FILL — Norethindrone & Ethinyl Estradiol-Fe Chew Tab 0.8 MG-25 MCG: ORAL | 84 days supply | Qty: 112 | Fill #0 | Status: AC

## 2020-09-01 ENCOUNTER — Other Ambulatory Visit: Payer: Self-pay

## 2020-09-01 ENCOUNTER — Ambulatory Visit
Admission: RE | Admit: 2020-09-01 | Discharge: 2020-09-01 | Disposition: A | Discharge status: Home / Self Care | Payer: No Typology Code available for payment source | Source: Ambulatory Visit | Attending: Internal Medicine | Admitting: Internal Medicine

## 2020-09-01 DIAGNOSIS — Z1231 Encounter for screening mammogram for malignant neoplasm of breast: Secondary | ICD-10-CM

## 2020-10-03 ENCOUNTER — Other Ambulatory Visit (HOSPITAL_COMMUNITY): Payer: Self-pay

## 2020-10-03 MED ORDER — NORETHIN-ETH ESTRADIOL-FE 0.8-25 MG-MCG PO CHEW
1.0000 | CHEWABLE_TABLET | Freq: Every day | ORAL | 4 refills | Status: DC
  Filled 2020-10-03 – 2020-11-27 (×2): qty 84, 84d supply, fill #0
  Filled 2021-02-06: qty 84, 72d supply, fill #1
  Filled 2021-04-12: qty 84, 72d supply, fill #2
  Filled 2021-06-28: qty 84, 72d supply, fill #3
  Filled 2021-09-11: qty 84, 72d supply, fill #4

## 2020-10-03 MED FILL — Montelukast Sodium Tab 10 MG (Base Equiv): ORAL | 90 days supply | Qty: 90 | Fill #0 | Status: AC

## 2020-10-07 ENCOUNTER — Other Ambulatory Visit (HOSPITAL_COMMUNITY): Payer: Self-pay

## 2020-10-07 MED ORDER — FLUTICASONE-SALMETEROL 100-50 MCG/ACT IN AEPB
1.0000 | INHALATION_SPRAY | Freq: Two times a day (BID) | RESPIRATORY_TRACT | 2 refills | Status: DC
  Filled 2020-10-07: qty 180, 90d supply, fill #0
  Filled 2021-03-09: qty 180, 90d supply, fill #1

## 2020-11-14 LAB — COLOGUARD: COLOGUARD: NEGATIVE

## 2020-11-27 ENCOUNTER — Other Ambulatory Visit (HOSPITAL_COMMUNITY): Payer: Self-pay

## 2020-12-28 ENCOUNTER — Other Ambulatory Visit (HOSPITAL_COMMUNITY): Payer: Self-pay

## 2021-01-03 ENCOUNTER — Other Ambulatory Visit (HOSPITAL_COMMUNITY): Payer: Self-pay

## 2021-01-03 MED FILL — Montelukast Sodium Tab 10 MG (Base Equiv): ORAL | 90 days supply | Qty: 90 | Fill #1 | Status: AC

## 2021-02-06 ENCOUNTER — Other Ambulatory Visit (HOSPITAL_COMMUNITY): Payer: Self-pay

## 2021-03-09 ENCOUNTER — Other Ambulatory Visit (HOSPITAL_COMMUNITY): Payer: Self-pay

## 2021-03-09 MED FILL — Fluticasone Propionate Nasal Susp 50 MCG/ACT: NASAL | 90 days supply | Qty: 48 | Fill #0 | Status: AC

## 2021-04-03 ENCOUNTER — Other Ambulatory Visit (HOSPITAL_COMMUNITY): Payer: Self-pay

## 2021-04-04 ENCOUNTER — Other Ambulatory Visit (HOSPITAL_COMMUNITY): Payer: Self-pay

## 2021-04-04 MED ORDER — MONTELUKAST SODIUM 10 MG PO TABS
10.0000 mg | ORAL_TABLET | Freq: Every evening | ORAL | 3 refills | Status: AC
  Filled 2021-04-04: qty 90, 90d supply, fill #0
  Filled 2021-06-28: qty 90, 90d supply, fill #1
  Filled 2021-10-03: qty 90, 90d supply, fill #2
  Filled 2021-12-29: qty 90, 90d supply, fill #3

## 2021-04-10 ENCOUNTER — Other Ambulatory Visit (HOSPITAL_COMMUNITY): Payer: Self-pay

## 2021-04-12 ENCOUNTER — Other Ambulatory Visit (HOSPITAL_COMMUNITY): Payer: Self-pay

## 2021-06-28 ENCOUNTER — Other Ambulatory Visit (HOSPITAL_COMMUNITY): Payer: Self-pay

## 2021-08-14 ENCOUNTER — Other Ambulatory Visit: Payer: Self-pay | Admitting: Internal Medicine

## 2021-08-14 DIAGNOSIS — Z1231 Encounter for screening mammogram for malignant neoplasm of breast: Secondary | ICD-10-CM

## 2021-09-03 ENCOUNTER — Other Ambulatory Visit (HOSPITAL_COMMUNITY): Payer: Self-pay

## 2021-09-03 MED ORDER — FLUTICASONE-SALMETEROL 100-50 MCG/ACT IN AEPB
1.0000 | INHALATION_SPRAY | Freq: Two times a day (BID) | RESPIRATORY_TRACT | 3 refills | Status: AC
  Filled 2021-09-03: qty 180, 90d supply, fill #0
  Filled 2021-12-29: qty 180, 90d supply, fill #1
  Filled 2022-06-04 – 2022-08-22 (×2): qty 180, 90d supply, fill #0

## 2021-09-07 DIAGNOSIS — Z1231 Encounter for screening mammogram for malignant neoplasm of breast: Secondary | ICD-10-CM

## 2021-09-11 ENCOUNTER — Other Ambulatory Visit (HOSPITAL_COMMUNITY): Payer: Self-pay

## 2021-09-21 ENCOUNTER — Ambulatory Visit
Admission: RE | Admit: 2021-09-21 | Discharge: 2021-09-21 | Disposition: A | Discharge status: Home / Self Care | Payer: No Typology Code available for payment source | Source: Ambulatory Visit | Attending: Internal Medicine | Admitting: Internal Medicine

## 2021-09-21 DIAGNOSIS — Z1231 Encounter for screening mammogram for malignant neoplasm of breast: Secondary | ICD-10-CM

## 2021-10-03 ENCOUNTER — Other Ambulatory Visit (HOSPITAL_COMMUNITY): Payer: Self-pay

## 2021-11-08 ENCOUNTER — Other Ambulatory Visit (HOSPITAL_COMMUNITY): Payer: Self-pay

## 2021-11-08 MED ORDER — NORETHIN-ETH ESTRADIOL-FE 0.8-25 MG-MCG PO CHEW
1.0000 | CHEWABLE_TABLET | Freq: Every day | ORAL | 4 refills | Status: DC
  Filled 2021-11-08 – 2021-11-23 (×2): qty 112, 84d supply, fill #0
  Filled 2022-02-26: qty 112, 84d supply, fill #1
  Filled 2022-05-30: qty 112, 84d supply, fill #2
  Filled 2022-09-06: qty 112, 84d supply, fill #3

## 2021-11-13 ENCOUNTER — Other Ambulatory Visit (HOSPITAL_COMMUNITY): Payer: Self-pay

## 2021-11-21 ENCOUNTER — Other Ambulatory Visit (HOSPITAL_COMMUNITY): Payer: Self-pay

## 2021-11-23 ENCOUNTER — Other Ambulatory Visit (HOSPITAL_COMMUNITY): Payer: Self-pay

## 2021-12-29 ENCOUNTER — Other Ambulatory Visit (HOSPITAL_COMMUNITY): Payer: Self-pay

## 2022-01-01 ENCOUNTER — Other Ambulatory Visit (HOSPITAL_COMMUNITY): Payer: Self-pay

## 2022-01-03 ENCOUNTER — Other Ambulatory Visit (HOSPITAL_COMMUNITY): Payer: Self-pay

## 2022-01-03 MED ORDER — FLUTICASONE PROPIONATE 50 MCG/ACT NA SUSP
NASAL | 3 refills | Status: AC
  Filled 2022-01-03: qty 48, 90d supply, fill #0
  Filled 2022-01-07 – 2022-09-08 (×2): qty 48, 90d supply, fill #1

## 2022-01-07 ENCOUNTER — Other Ambulatory Visit (HOSPITAL_COMMUNITY): Payer: Self-pay

## 2022-01-21 ENCOUNTER — Other Ambulatory Visit (HOSPITAL_COMMUNITY): Payer: Self-pay

## 2022-02-26 ENCOUNTER — Other Ambulatory Visit (HOSPITAL_COMMUNITY): Payer: Self-pay

## 2022-02-27 ENCOUNTER — Other Ambulatory Visit (HOSPITAL_COMMUNITY): Payer: Self-pay

## 2022-03-01 ENCOUNTER — Other Ambulatory Visit (HOSPITAL_COMMUNITY): Payer: Self-pay

## 2022-03-01 MED ORDER — MONTELUKAST SODIUM 10 MG PO TABS
10.0000 mg | ORAL_TABLET | Freq: Every evening | ORAL | 1 refills | Status: DC
  Filled 2022-03-01 – 2022-04-08 (×2): qty 90, 90d supply, fill #0
  Filled 2022-08-28: qty 90, 90d supply, fill #1

## 2022-03-19 ENCOUNTER — Other Ambulatory Visit (HOSPITAL_COMMUNITY): Payer: Self-pay

## 2022-04-03 ENCOUNTER — Other Ambulatory Visit (HOSPITAL_COMMUNITY): Payer: Self-pay

## 2022-04-08 ENCOUNTER — Other Ambulatory Visit (HOSPITAL_COMMUNITY): Payer: Self-pay

## 2022-05-10 DIAGNOSIS — F4322 Adjustment disorder with anxiety: Secondary | ICD-10-CM | POA: Diagnosis not present

## 2022-05-30 ENCOUNTER — Other Ambulatory Visit (HOSPITAL_COMMUNITY): Payer: Self-pay

## 2022-05-31 DIAGNOSIS — F4322 Adjustment disorder with anxiety: Secondary | ICD-10-CM | POA: Diagnosis not present

## 2022-05-31 DIAGNOSIS — H6991 Unspecified Eustachian tube disorder, right ear: Secondary | ICD-10-CM | POA: Diagnosis not present

## 2022-06-04 ENCOUNTER — Other Ambulatory Visit (HOSPITAL_COMMUNITY): Payer: Self-pay

## 2022-06-04 DIAGNOSIS — H5213 Myopia, bilateral: Secondary | ICD-10-CM | POA: Diagnosis not present

## 2022-06-04 DIAGNOSIS — G43909 Migraine, unspecified, not intractable, without status migrainosus: Secondary | ICD-10-CM | POA: Diagnosis not present

## 2022-06-04 DIAGNOSIS — D3131 Benign neoplasm of right choroid: Secondary | ICD-10-CM | POA: Diagnosis not present

## 2022-06-05 ENCOUNTER — Other Ambulatory Visit (HOSPITAL_COMMUNITY): Payer: Self-pay

## 2022-06-18 DIAGNOSIS — F4322 Adjustment disorder with anxiety: Secondary | ICD-10-CM | POA: Diagnosis not present

## 2022-07-19 DIAGNOSIS — F4322 Adjustment disorder with anxiety: Secondary | ICD-10-CM | POA: Diagnosis not present

## 2022-08-13 ENCOUNTER — Other Ambulatory Visit (HOSPITAL_COMMUNITY): Payer: Self-pay

## 2022-08-13 DIAGNOSIS — F4322 Adjustment disorder with anxiety: Secondary | ICD-10-CM | POA: Diagnosis not present

## 2022-08-13 DIAGNOSIS — M533 Sacrococcygeal disorders, not elsewhere classified: Secondary | ICD-10-CM | POA: Diagnosis not present

## 2022-08-13 DIAGNOSIS — M545 Low back pain, unspecified: Secondary | ICD-10-CM | POA: Diagnosis not present

## 2022-08-13 MED ORDER — MELOXICAM 15 MG PO TABS
15.0000 mg | ORAL_TABLET | Freq: Every day | ORAL | 1 refills | Status: AC
  Filled 2022-08-13: qty 30, 30d supply, fill #0
  Filled 2022-09-08: qty 30, 30d supply, fill #1

## 2022-08-14 ENCOUNTER — Ambulatory Visit
Admission: RE | Admit: 2022-08-14 | Discharge: 2022-08-14 | Disposition: A | Discharge status: Home / Self Care | Payer: 59 | Source: Ambulatory Visit | Attending: Internal Medicine | Admitting: Internal Medicine

## 2022-08-14 ENCOUNTER — Other Ambulatory Visit: Payer: Self-pay | Admitting: Internal Medicine

## 2022-08-14 DIAGNOSIS — M545 Low back pain, unspecified: Secondary | ICD-10-CM

## 2022-08-14 DIAGNOSIS — M48061 Spinal stenosis, lumbar region without neurogenic claudication: Secondary | ICD-10-CM | POA: Diagnosis not present

## 2022-08-14 DIAGNOSIS — M47816 Spondylosis without myelopathy or radiculopathy, lumbar region: Secondary | ICD-10-CM | POA: Diagnosis not present

## 2022-08-14 DIAGNOSIS — M533 Sacrococcygeal disorders, not elsewhere classified: Secondary | ICD-10-CM | POA: Diagnosis not present

## 2022-08-22 ENCOUNTER — Other Ambulatory Visit (HOSPITAL_COMMUNITY): Payer: Self-pay

## 2022-08-24 ENCOUNTER — Other Ambulatory Visit (HOSPITAL_COMMUNITY): Payer: Self-pay

## 2022-08-26 NOTE — Therapy (Signed)
OUTPATIENT PHYSICAL THERAPY THORACOLUMBAR EVALUATION   Patient Name: Kathryn Cohen MRN: [id number] DOB:August 06, 1975, 47 y.o., female Today's Date: 08/27/22  END OF SESSION:   Past Medical History:  Diagnosis Date   Allergy    Asthma    Back pain    Breast hypertrophy    Laceration of hand    rt long, ring and small fingers   Neck pain    Past Surgical History:  Procedure Laterality Date   BREAST REDUCTION SURGERY Bilateral 03/14/2017   Procedure: BILATERAL MAMMARY REDUCTION  (BREAST);  Surgeon: Glenna Fellows, MD;  Location: [facility];  Service: Plastics;  Laterality: Bilateral;   NERVE, TENDON AND ARTERY REPAIR Right 02/02/2015   Procedure: RIGHT LONG RING SMALL FINGER REPAIR NERVE, TENDON AND ARTERY REPAIR;  Surgeon: Betha Loa, MD;  Location: [facility];  Service: Orthopedics;  Laterality: Right;   REDUCTION MAMMAPLASTY Bilateral 02/2017   Patient Active Problem List   Diagnosis Date Noted   Asthma 02/24/2011   Allergic rhinitis 02/24/2011    PCP: Polite,Ronald MD  REFERRING PROVIDER: Polite,Ronald MD  REFERRING DIAG: low back pain  Rationale for Evaluation and Treatment: Rehabilitation  THERAPY DIAG:  Back pain; weakness  ONSET DATE: ***  SUBJECTIVE:                                                                                                                                                                                           SUBJECTIVE STATEMENT: ***  PERTINENT HISTORY:  ***  PAIN:  PAIN:  Are you having pain? Yes NPRS scale: ***/10 Pain location: *** Aggravating factors: *** Relieving factors: ***   PRECAUTIONS: {Therapy precautions:24002}  WEIGHT BEARING RESTRICTIONS: No  FALLS:  Has patient fallen in last 6 months? No  LIVING ENVIRONMENT: Lives with: {OPRC lives with:25569::"lives with their family"} Lives in: {Lives in:25570} Stairs: {opstairs:27293} Has following equipment at home:  {Assistive devices:23999}  OCCUPATION: ***  PLOF: {PLOF:24004}  PATIENT GOALS: ***  NEXT MD VISIT: ***  OBJECTIVE:   DIAGNOSTIC FINDINGS:  ***  PATIENT SURVEYS:  FOTO ***  COGNITION: Overall cognitive status: Within functional limits for tasks assessed     SENSATION: {sensation:27233}  MUSCLE LENGTH: Hamstrings: Right *** deg; Left *** deg Thomas test: Right *** deg; Left *** deg  POSTURE: {posture:25561}  PALPATION: ***  LUMBAR ROM:   AROM eval  Flexion   Extension   Right lateral flexion   Left lateral flexion   Right rotation   Left rotation    (Blank rows = not tested)  LOWER EXTREMITY ROM:     TRUNK STRENGTH:  Decreased activation of transverse abdominus  muscles; abdominals 4-/5; decreased activation of lumbar multifidi; trunk extensors 4-/5   LOWER EXTREMITY MMT:    MMT Right eval Left eval  Hip flexion    Hip extension    Hip abduction    Hip adduction    Hip internal rotation    Hip external rotation    Knee flexion    Knee extension    Ankle dorsiflexion    Ankle plantarflexion    Ankle inversion    Ankle eversion     (Blank rows = not tested)  LUMBAR SPECIAL TESTS:  {lumbar special test:25242}  FUNCTIONAL TESTS:  {Functional tests:24029}  GAIT: Comments: ***  TODAY'S TREATMENT:                                                                                                                              DATE: 4/30    PATIENT EDUCATION:  Education details: Educated patient on anatomy and physiology of current symptoms, prognosis, plan of care as well as initial self care strategies to promote recovery  Person educated: Patient Education method: Explanation Education comprehension: verbalized understanding  HOME EXERCISE PROGRAM: ***  ASSESSMENT:  CLINICAL IMPRESSION: Patient is a 47 y.o. female who was seen today for physical therapy evaluation and treatment for low back pain. The patient would benefit from PT to  address trunk and hip range of motion deficits, strength asymmetries in lumbo/pelvic and hip regions and pain levels that are currently affecting activities of daily living at home and work including sitting, standing, sleeping, walking, lifting and performing hobbies and recreational activities.     OBJECTIVE IMPAIRMENTS: {opptimpairments:25111}.   ACTIVITY LIMITATIONS: {activitylimitations:27494}  PARTICIPATION LIMITATIONS: {participationrestrictions:25113}  PERSONAL FACTORS: {Personal factors:25162} are also affecting patient's functional outcome.   REHAB POTENTIAL: Good  CLINICAL DECISION MAKING: Stable/uncomplicated  EVALUATION COMPLEXITY: Low   GOALS: Goals reviewed with patient? Yes  SHORT TERM GOALS: Target date: ***  The patient will demonstrate knowledge of basic self care strategies and exercises to promote healing   Baseline: Goal status: INITIAL  2.  *** Baseline:  Goal status: INITIAL  3.  *** Baseline:  Goal status: INITIAL  4.  *** Baseline:  Goal status: INITIAL   LONG TERM GOALS: Target date: ***  The patient will be independent in a safe self progression of a home exercise program to promote further recovery of function  Baseline:  Goal status: INITIAL  2.  *** Baseline:  Goal status: INITIAL  3.  *** Baseline:  Goal status: INITIAL  4.  *** Baseline:  Goal status: INITIAL  5.  *** Baseline:  Goal status: {GOALSTATUS:25110}    PLAN:  PT FREQUENCY: {rehab frequency:25116}  PT DURATION: 8 weeks  PLANNED INTERVENTIONS: {rehab planned interventions:25118::"Therapeutic exercises","Therapeutic activity","Neuromuscular re-education","Balance training","Gait training","Patient/Family education","Self Care","Joint mobilization"}.  PLAN FOR NEXT SESSION: ***

## 2022-08-27 ENCOUNTER — Ambulatory Visit: Payer: 59 | Attending: Internal Medicine | Admitting: Physical Therapy

## 2022-08-27 ENCOUNTER — Other Ambulatory Visit: Payer: Self-pay

## 2022-08-27 DIAGNOSIS — R531 Weakness: Secondary | ICD-10-CM | POA: Insufficient documentation

## 2022-08-27 DIAGNOSIS — M5459 Other low back pain: Secondary | ICD-10-CM | POA: Diagnosis not present

## 2022-08-28 ENCOUNTER — Other Ambulatory Visit (HOSPITAL_COMMUNITY): Payer: Self-pay

## 2022-08-29 ENCOUNTER — Ambulatory Visit: Payer: 59 | Attending: Internal Medicine | Admitting: Physical Therapy

## 2022-08-29 DIAGNOSIS — R531 Weakness: Secondary | ICD-10-CM

## 2022-08-29 DIAGNOSIS — M5459 Other low back pain: Secondary | ICD-10-CM | POA: Diagnosis not present

## 2022-08-29 NOTE — Patient Instructions (Signed)

## 2022-08-29 NOTE — Therapy (Signed)
OUTPATIENT PHYSICAL THERAPY THORACOLUMBAR PROGRESS NOTE   Patient Name: Kathryn Cohen MRN: [id number] DOB:07-Feb-1976, 47 y.o., female Today's Date: 08/27/22  END OF SESSION:  PT End of Session - 08/29/22 0840     Visit Number 2    Date for PT Re-Evaluation 10/22/22    Authorization Type [facility] [employer]    PT Start Time [phone]    PT Stop Time 0923    PT Time Calculation (min) 40 min    Activity Tolerance Patient tolerated treatment well             Past Medical History:  Diagnosis Date   Allergy    Asthma    Back pain    Breast hypertrophy    Laceration of hand    rt long, ring and small fingers   Neck pain    Past Surgical History:  Procedure Laterality Date   BREAST REDUCTION SURGERY Bilateral 03/14/2017   Procedure: BILATERAL MAMMARY REDUCTION  (BREAST);  Surgeon: Kathryn Fellows, MD;  Location: [facility];  Service: Plastics;  Laterality: Bilateral;   NERVE, TENDON AND ARTERY REPAIR Right 02/02/2015   Procedure: RIGHT LONG RING SMALL FINGER REPAIR NERVE, TENDON AND ARTERY REPAIR;  Surgeon: Kathryn Loa, MD;  Location: [facility];  Service: Orthopedics;  Laterality: Right;   REDUCTION MAMMAPLASTY Bilateral 02/2017   Patient Active Problem List   Diagnosis Date Noted   Asthma 02/24/2011   Allergic rhinitis 02/24/2011    PCP: Cohen,Ronald MD  REFERRING PROVIDER: Polite,Ronald MD  REFERRING DIAG: low back pain  Rationale for Evaluation and Treatment: Rehabilitation  THERAPY DIAG:  Back pain; weakness  ONSET DATE: 04/29/2022  SUBJECTIVE:                                                                                                                                                                                           SUBJECTIVE STATEMENT: Feeling irritation this morning.  Tailbone area to the left.  Standing in waiting area.  Getting swimming in and it feels great.     PERTINENT HISTORY:  Goes by Kathryn Cohen Ongoing  problem related to a fall off horse at 47 years old; resolved but lifted something heavy triggered an exacerbation; then college age back spasms; got PT and acupuncture (bad experience lots of providers, lying lifted leg with weight worsened) Recent illness lying down flared up back Lots of recent travel may have aggravated Patient unable to sit for the evaluation (standing more comfortable) 68 yo daughter PAIN:  PAIN:  Are you having pain? Yes NPRS scale: 4/10 high level irritation right now Pain location: feels like a pebble  at the tailbone; left low back and lateral hip  Aggravating factors: sitting > 1 hour sharp pains 7/10; walking inclines will hurt  Relieving factors: swims; stretching midback; yoga every morning; usually walking ok on level ground  PRECAUTIONS: None  WEIGHT BEARING RESTRICTIONS: No  FALLS:  Has patient fallen in last 6 months? No  LIVING ENVIRONMENT: Lives with: lives with their family Lives in: House/apartment   OCCUPATION: standing desk, tries to limit sitting  PLOF: Independent  PATIENT GOALS: reduce my pain; more functional I can barely sit at all; wants to travel;  mid June airplane [place];  need to be able to walk normal speed keep up with daughter 82 year old  NEXT MD VISIT: nothing scheduled  OBJECTIVE:   DIAGNOSTIC FINDINGS:  X-rays Mild degen lower thoracic  PATIENT SURVEYS:  Modified Oswestry: 40% moderate disability  COGNITION: Overall cognitive status: Within functional limits for tasks assessed       MUSCLE LENGTH: Hamstrings: Right 75 deg; Left 75 deg Thomas test: Right 10 deg; Left 10 deg  POSTURE: No Significant postural limitations  PALPATION: Decreased QL mobility  LUMBAR ROM:   AROM eval  Flexion Fingertips to shoes with feet together  Extension 20  Right lateral flexion 20  Left lateral flexion 20  Right rotation   Left rotation    (Blank rows = not tested)  LOWER EXTREMITY ROM:   decreased right hip internal  rotation ROM 5 degrees; left internal rotation 12 degrees  TRUNK STRENGTH:  Decreased activation of transverse abdominus muscles; abdominals 4/5; decreased activation of lumbar multifidi; trunk extensors 4/5  Bird dog 4/5; modified plank 4/5; bent knee lift and lower= abs 4/5  LOWER EXTREMITY MMT:    MMT Right eval Left eval  Hip flexion 5 5  Hip extension 4 4  Hip abduction 4 4  Hip adduction    Hip internal rotation    Hip external rotation 4 4  Knee flexion    Knee extension 5 5  Ankle dorsiflexion    Ankle plantarflexion    Ankle inversion    Ankle eversion     (Blank rows = not tested)  LUMBAR SPECIAL TESTS:  Straight leg raise test: Negative and Single leg stance test: Negative   GAIT: Comments: decreased gait speed  TODAY'S TREATMENT:                                                                                                                              DATE: 5/1 Therapeutic ex:  supine piriformis stretch 20 sec hold Abdominal brace: with hand to opp knee isometric push 10x;  bent knee lift and lower 10x Sidelying clam left 10x Modified plank Hip hinge using golf club to maintain 3 points of contact Manual therapy: KT star pattern; soft tissue mobilization to left gluteals, piriformis and bil lumbar paraspinals Trigger Point Dry-Needling  Treatment instructions: Expect mild to moderate muscle soreness. S/S of pneumothorax if dry needled over  a lung field, and to seek immediate medical attention should they occur. Patient verbalized understanding of these instructions and education.  Patient Consent Given: Yes Education handout provided: Yes Muscles treated: left gluteals, piriformis in sidelying and bil lumbar multifidi Electrical stimulation performed: No Parameters: N/A Treatment response/outcome: improved soft tissue mobility and decreased tender points     PATIENT EDUCATION:  Education details: Educated patient on anatomy and physiology of current  symptoms, prognosis, plan of care as well as initial self care strategies to promote recovery Use of lumbar roll when sitting to avoid sacral sitting Person educated: Patient Education method: Explanation Education comprehension: verbalized understanding  HOME EXERCISE PROGRAM: Access Code: [email or web address] URL: [email or web address] Date: 08/29/2022 Prepared by: Lavinia Sharps  Exercises - Supine Piriformis Stretch (Mirrored)  - 1 x daily - 7 x weekly - 1 sets - 3 reps - 20 hold - Clamshell  - 1 x daily - 7 x weekly - 1 sets - 10 reps - Hooklying Isometric Hip Flexion  - 1 x daily - 7 x weekly - 1 sets - 5 reps - Hooklying Sequential Leg March and Lower  - 1 x daily - 7 x weekly - 1 sets - 10 reps - Plank on Knees  - 1 x daily - 7 x weekly - 1 sets - 10 reps  ASSESSMENT:  CLINICAL IMPRESSION: Initiated lumbo/pelvic/hip core stabilization ex's in lying positions with verbal and tactile cues to optimize benefit.  May need further instruction on hip hinge technique.  Good initial response to DN with improved left gluteal and lumbar fascial mobility.  Therapist monitoring response to all interventions and modifying treatment accordingly.     OBJECTIVE IMPAIRMENTS: decreased activity tolerance, decreased mobility, difficulty walking, decreased ROM, decreased strength, increased fascial restrictions, impaired perceived functional ability, and pain.   ACTIVITY LIMITATIONS: sitting, standing, sleeping, and locomotion level  PARTICIPATION LIMITATIONS: driving, community activity, and occupation  PERSONAL FACTORS: Past/current experiences and Time since onset of injury/illness/exacerbation are also affecting patient's functional outcome.   REHAB POTENTIAL: Good  CLINICAL DECISION MAKING: Stable/uncomplicated  EVALUATION COMPLEXITY: Low   GOALS: Goals reviewed with patient? Yes  SHORT TERM GOALS: Target date: 09/24/2022      The patient will demonstrate knowledge of basic  self care strategies and exercises to promote healing   Baseline: Goal status: INITIAL  2.  The patient will report a 30% improvement in pain levels with functional activities which are currently difficult including sitting for work and traveling  (upcoming trip to [place]) Baseline:  Goal status: INITIAL  3.  The patient will have improved hip strength to at least 4+/5 needed for standing, walking inclines, walking longer distances  Baseline:  Goal status: INITIAL  4. Improved right hip internal rotation ROM to 15 degrees needed for improved  Baseline:  Goal status: INITIAL   LONG TERM GOALS: Target date: 10/22/2022    The patient will be independent in a safe self progression of a home exercise program to promote further recovery of function  Baseline:  Goal status: INITIAL  2.  The patient will report a 70% improvement in pain levels with functional activities which are currently difficult including sitting Baseline:  Goal status: INITIAL  3.  The patient will have improved trunk flexor and extensor muscle strength to at least 4+/5 needed for lifting medium weight objects such as grocery bags, laundry and luggage  Baseline:  Goal status: INITIAL  4.  Improved sitting tolerance for flight to [place] and gait stamina for walking  moderate community distances for travel Baseline:  Goal status: INITIAL  5.  Modified Oswestry score improved to 30% indicating improved function with less pain Baseline:  Goal status: INITIAL    PLAN:  PT FREQUENCY: 2x/week  PT DURATION: 8 weeks  PLANNED INTERVENTIONS: Therapeutic exercises, Therapeutic activity, Neuromuscular re-education, Patient/Family education, Self Care, Joint mobilization, Aquatic Therapy, Dry Needling, Spinal mobilization, Cryotherapy, Moist heat, Taping, Traction, Ionotophoresis 4mg /ml Dexamethasone, Manual therapy, and Re-evaluation.  PLAN FOR NEXT SESSION: assess response to DN#1 and add QL and ES as needed;  KT taping;   bil hip mobs; hip internal rotation ROM;  QL, bil glute strengthening; core strengthening progress to standing; review and progress hip hinge  Lavinia Sharps, PT 08/29/22 11:18 AM Phone: [phone] Fax: [phone]

## 2022-08-30 ENCOUNTER — Other Ambulatory Visit: Payer: Self-pay | Admitting: Obstetrics and Gynecology

## 2022-08-30 DIAGNOSIS — Z1231 Encounter for screening mammogram for malignant neoplasm of breast: Secondary | ICD-10-CM

## 2022-09-03 DIAGNOSIS — F4322 Adjustment disorder with anxiety: Secondary | ICD-10-CM | POA: Diagnosis not present

## 2022-09-05 ENCOUNTER — Ambulatory Visit: Payer: 59 | Admitting: Physical Therapy

## 2022-09-05 DIAGNOSIS — R531 Weakness: Secondary | ICD-10-CM

## 2022-09-05 DIAGNOSIS — M5459 Other low back pain: Secondary | ICD-10-CM

## 2022-09-05 NOTE — Therapy (Signed)
OUTPATIENT PHYSICAL THERAPY THORACOLUMBAR PROGRESS NOTE   Patient Name: Kathryn Cohen MRN: [id number] DOB:02-Jul-1975, 47 y.o., female Today's Date: 08/27/22  END OF SESSION:  PT End of Session - 08/29/22 0840     Visit Number 2    Date for PT Re-Evaluation 10/22/22    Authorization Type [facility] [employer]    PT Start Time [id number]    PT Stop Time 0923    PT Time Calculation (min) 40 min    Activity Tolerance Patient tolerated treatment well             Past Medical History:  Diagnosis Date   Allergy    Asthma    Back pain    Breast hypertrophy    Laceration of hand    rt long, ring and small fingers   Neck pain    Past Surgical History:  Procedure Laterality Date   BREAST REDUCTION SURGERY Bilateral 03/14/2017   Procedure: BILATERAL MAMMARY REDUCTION  (BREAST);  Surgeon: Kathryn Fellows, MD;  Location: [facility];  Service: Plastics;  Laterality: Bilateral;   NERVE, TENDON AND ARTERY REPAIR Right 02/02/2015   Procedure: RIGHT LONG RING SMALL FINGER REPAIR NERVE, TENDON AND ARTERY REPAIR;  Surgeon: Kathryn Loa, MD;  Location: [facility];  Service: Orthopedics;  Laterality: Right;   REDUCTION MAMMAPLASTY Bilateral 02/2017   Patient Active Problem List   Diagnosis Date Noted   Asthma 02/24/2011   Allergic rhinitis 02/24/2011    PCP: Cohen,Ronald MD  REFERRING PROVIDER: Polite,Ronald MD  REFERRING DIAG: low back pain  Rationale for Evaluation and Treatment: Rehabilitation  THERAPY DIAG:  Back pain; weakness  ONSET DATE: 04/29/2022  SUBJECTIVE:                                                                                                                                                                                           SUBJECTIVE STATEMENT: I think the DN was worth doing.  The KT tape helped as well, wore for 4 days.   Left tailbone area is less tight and less irritated.  Able to walk 25 minutes with minimal irritation.     PERTINENT HISTORY:  Goes by Kathryn Cohen Ongoing problem related to a fall off horse at 47 years old; resolved but lifted something heavy triggered an exacerbation; then college age back spasms; got PT and acupuncture (bad experience lots of providers, lying lifted leg with weight worsened) Recent illness lying down flared up back Lots of recent travel may have aggravated Patient unable to sit for the evaluation (standing more comfortable) 67 yo daughter PAIN:  PAIN:  Are you having pain? Yes  NPRS scale: 2/10  Pain location: feels like a pebble at the tailbone; left low back and lateral hip  Aggravating factors: sitting > 1 hour sharp pains 7/10; walking inclines will hurt  Relieving factors: swims; stretching midback; yoga every morning; usually walking ok on level ground  PRECAUTIONS: None  WEIGHT BEARING RESTRICTIONS: No  FALLS:  Has patient fallen in last 6 months? No  LIVING ENVIRONMENT: Lives with: lives with their family Lives in: House/apartment   OCCUPATION: standing desk, tries to limit sitting  PLOF: Independent  PATIENT GOALS: reduce my pain; more functional I can barely sit at all; wants to travel;  mid June airplane [place];  need to be able to walk normal speed keep up with daughter 72 year old  NEXT MD VISIT: nothing scheduled  OBJECTIVE:   DIAGNOSTIC FINDINGS:  X-rays Mild degen lower thoracic  PATIENT SURVEYS:  Modified Oswestry: 40% moderate disability  COGNITION: Overall cognitive status: Within functional limits for tasks assessed       MUSCLE LENGTH: Hamstrings: Right 75 deg; Left 75 deg Thomas test: Right 10 deg; Left 10 deg  POSTURE: No Significant postural limitations  PALPATION: Decreased QL mobility  LUMBAR ROM:   AROM eval  Flexion Fingertips to shoes with feet together  Extension 20  Right lateral flexion 20  Left lateral flexion 20  Right rotation   Left rotation    (Blank rows = not tested)  LOWER EXTREMITY ROM:   decreased  right hip internal rotation ROM 5 degrees; left internal rotation 12 degrees  TRUNK STRENGTH:  Decreased activation of transverse abdominus muscles; abdominals 4/5; decreased activation of lumbar multifidi; trunk extensors 4/5  Bird dog 4/5; modified plank 4/5; bent knee lift and lower= abs 4/5  LOWER EXTREMITY MMT:    MMT Right eval Left eval  Hip flexion 5 5  Hip extension 4 4  Hip abduction 4 4  Hip adduction    Hip internal rotation    Hip external rotation 4 4  Knee flexion    Knee extension 5 5  Ankle dorsiflexion    Ankle plantarflexion    Ankle inversion    Ankle eversion     (Blank rows = not tested)  LUMBAR SPECIAL TESTS:  Straight leg raise test: Negative and Single leg stance test: Negative   GAIT: Comments: decreased gait speed  TODAY'S TREATMENT:           DATE: 5/9 Therapeutic ex:  Review of modified planks with pt demo of good technique Discussed quadruped hover alternative to plank Trial of ex's for coccygeal pain:  childs pose or quadruped rocking (feels good), quadruped tail wags (feels irritating so discontinued); standing hip hinge while holding back of the chair with hips internally rotated (feels good) Manual therapy:  soft tissue mobilization to left QL and bil lumbar paraspinals McConnell tape applied in prone:  3 inch strip (white and brown tape) from coccygeal region pulled superiorly Patient instructed to remove before bedtime tonight Trigger Point Dry-Needling  Treatment instructions: Expect mild to moderate muscle soreness. S/S of pneumothorax if dry needled over a lung field, and to seek immediate medical attention should they occur. Patient verbalized understanding of these instructions and education.  Patient Consent Given: Yes Education handout provided: Yes Muscles treated: left QL in sidelying and bil lumbar multifidi in prone Electrical stimulation performed: yes Parameters: 1.0 ma 8 min to bil lumbar multifidi Treatment  response/outcome: improved soft tissue mobility and decreased tender points  DATE: 5/1 Therapeutic ex:  supine piriformis stretch 20 sec hold Abdominal brace: with hand to opp knee isometric push 10x;  bent knee lift and lower 10x Sidelying clam left 10x Modified plank Hip hinge using golf club to maintain 3 points of contact Manual therapy: KT star pattern; soft tissue mobilization to left gluteals, piriformis and bil lumbar paraspinals Trigger Point Dry-Needling  Treatment instructions: Expect mild to moderate muscle soreness. S/S of pneumothorax if dry needled over a lung field, and to seek immediate medical attention should they occur. Patient verbalized understanding of these instructions and education.  Patient Consent Given: Yes Education handout provided: Yes Muscles treated: left gluteals, piriformis in sidelying and bil lumbar multifidi Electrical stimulation performed: No Parameters: N/A Treatment response/outcome: improved soft tissue mobility and decreased tender points     PATIENT EDUCATION:  Education details: Educated patient on anatomy and physiology of current symptoms, prognosis, plan of care as well as initial self care strategies to promote recovery Use of lumbar roll when sitting to avoid sacral sitting Person educated: Patient Education method: Explanation Education comprehension: verbalized understanding  HOME EXERCISE PROGRAM: Access Code: [email or web address] URL: [email or web address] Date: 09/05/2022 Prepared by: Lavinia Sharps  Exercises - Supine Piriformis Stretch (Mirrored)  - 1 x daily - 7 x weekly - 1 sets - 3 reps - 20 hold - Clamshell  - 1 x daily - 7 x weekly - 1 sets - 10 reps - Hooklying Isometric Hip Flexion  - 1 x daily - 7 x weekly - 1 sets - 5 reps - Hooklying Sequential Leg March and Lower  - 1 x daily - 7 x weekly -  1 sets - 10 reps - Plank on Knees  - 1 x daily - 7 x weekly - 1 sets - 10 reps - Quadruped Rocking Slow  - 1 x daily - 7 x weekly - 1 sets - 10 reps - Standing Hip Internal and External Rotation AROM  - 1 x daily - 7 x weekly - 1 sets - 10 reps  ASSESSMENT:  CLINICAL IMPRESSION: Decreasing pain levels overall in lumbar region and less symptom irritability.   Functional improvements reported with home and work tasks.  Coccygeal pain improved with gapping/opening strategies in quadruped and with standing hip internal rotation.  Good initial response to DN with ES to lumbar musculature.  Therapist monitoring response to all interventions and modifying treatment accordingly.  HEP continues to be updated and progressed.    OBJECTIVE IMPAIRMENTS: decreased activity tolerance, decreased mobility, difficulty walking, decreased ROM, decreased strength, increased fascial restrictions, impaired perceived functional ability, and pain.   ACTIVITY LIMITATIONS: sitting, standing, sleeping, and locomotion level  PARTICIPATION LIMITATIONS: driving, community activity, and occupation  PERSONAL FACTORS: Past/current experiences and Time since onset of injury/illness/exacerbation are also affecting patient's functional outcome.   REHAB POTENTIAL: Good  CLINICAL DECISION MAKING: Stable/uncomplicated  EVALUATION COMPLEXITY: Low   GOALS: Goals reviewed with patient? Yes  SHORT TERM GOALS: Target date: 09/24/2022      The patient will demonstrate knowledge of basic self care strategies and exercises to promote healing   Baseline: Goal status: INITIAL  2.  The patient will report a 30% improvement in pain levels with functional activities which are currently difficult including sitting for work and traveling  (upcoming trip to [place]) Baseline:  Goal status: INITIAL  3.  The patient will have improved hip strength to at least 4+/5 needed for standing, walking inclines, walking longer distances   Baseline:  Goal status: INITIAL  4. Improved right hip internal rotation ROM to 15 degrees needed for improved  Baseline:  Goal status: INITIAL   LONG TERM GOALS: Target date: 10/22/2022    The patient will be independent in a safe self progression of a home exercise program to promote further recovery of function  Baseline:  Goal status: INITIAL  2.  The patient will report a 70% improvement in pain levels with functional activities which are currently difficult including sitting Baseline:  Goal status: INITIAL  3.  The patient will have improved trunk flexor and extensor muscle strength to at least 4+/5 needed for lifting medium weight objects such as grocery bags, laundry and luggage  Baseline:  Goal status: INITIAL  4.  Improved sitting tolerance for flight to [place] and gait stamina for walking moderate community distances for travel Baseline:  Goal status: INITIAL  5.  Modified Oswestry score improved to 30% indicating improved function with less pain Baseline:  Goal status: INITIAL    PLAN:  PT FREQUENCY: 2x/week  PT DURATION: 8 weeks  PLANNED INTERVENTIONS: Therapeutic exercises, Therapeutic activity, Neuromuscular re-education, Patient/Family education, Self Care, Joint mobilization, Aquatic Therapy, Dry Needling, Spinal mobilization, Cryotherapy, Moist heat, Taping, Traction, Ionotophoresis 4mg /ml Dexamethasone, Manual therapy, and Re-evaluation.  PLAN FOR NEXT SESSION: assess response to DN of QL and DN with ES to lumbar multifidi; check response to McConnell taping of coccyx;  bil hip mobs; hip internal rotation ROM;  QL, bil glute strengthening; core strengthening progress to standing; review and progress hip hinge  Lavinia Sharps, PT 09/05/22 2:49 PM Phone: [phone] Fax: [phone]

## 2022-09-06 ENCOUNTER — Other Ambulatory Visit (HOSPITAL_COMMUNITY): Payer: Self-pay

## 2022-09-09 ENCOUNTER — Other Ambulatory Visit: Payer: Self-pay

## 2022-09-11 ENCOUNTER — Encounter: Payer: Self-pay | Admitting: Rehabilitative and Restorative Service Providers"

## 2022-09-11 ENCOUNTER — Ambulatory Visit: Payer: 59 | Admitting: Rehabilitative and Restorative Service Providers"

## 2022-09-11 DIAGNOSIS — M5459 Other low back pain: Secondary | ICD-10-CM | POA: Diagnosis not present

## 2022-09-11 DIAGNOSIS — R531 Weakness: Secondary | ICD-10-CM

## 2022-09-11 NOTE — Therapy (Addendum)
OUTPATIENT PHYSICAL THERAPY TREATMENT NOTE   Patient Name: Kathryn Cohen MRN: [id number] DOB:09-28-1975, 47 y.o., female Today's Date: 08/27/22  END OF SESSION:  PT End of Session - 09/11/22 1226     Visit Number 4    Date for PT Re-Evaluation 10/22/22    Authorization Type [facility] [employer]    PT Start Time 1225    PT Stop Time 1305    PT Time Calculation (min) 40 min    Activity Tolerance Patient tolerated treatment well    Behavior During Therapy WFL for tasks assessed/performed             Past Medical History:  Diagnosis Date   Allergy    Asthma    Back pain    Breast hypertrophy    Laceration of hand    rt long, ring and small fingers   Neck pain    Past Surgical History:  Procedure Laterality Date   BREAST REDUCTION SURGERY Bilateral 03/14/2017   Procedure: BILATERAL MAMMARY REDUCTION  (BREAST);  Surgeon: Glenna Fellows, MD;  Location: [facility];  Service: Plastics;  Laterality: Bilateral;   NERVE, TENDON AND ARTERY REPAIR Right 02/02/2015   Procedure: RIGHT LONG RING SMALL FINGER REPAIR NERVE, TENDON AND ARTERY REPAIR;  Surgeon: Betha Loa, MD;  Location: [facility];  Service: Orthopedics;  Laterality: Right;   REDUCTION MAMMAPLASTY Bilateral 02/2017   Patient Active Problem List   Diagnosis Date Noted   Asthma 02/24/2011   Allergic rhinitis 02/24/2011    PCP: Polite,Ronald MD  REFERRING PROVIDER: Polite,Ronald MD  REFERRING DIAG: low back pain  Rationale for Evaluation and Treatment: Rehabilitation  THERAPY DIAG:  Back pain; weakness  ONSET DATE: 04/29/2022  SUBJECTIVE:                                                                                                                                                                                           SUBJECTIVE STATEMENT: Patient reports that she liked the Kinesiotape better than the tape used last time.  Patient does report that dry needling has been  helpful.  States that she continues to have increased pain with various positions.  PERTINENT HISTORY:  Goes by [name] Ongoing problem related to a fall off horse at 47 years old; resolved but lifted something heavy triggered an exacerbation; then college age back spasms; got PT and acupuncture (bad experience lots of providers, lying lifted leg with weight worsened) Recent illness lying down flared up back Lots of recent travel may have aggravated Patient unable to sit for the evaluation (standing more comfortable) 7 yo daughter PAIN:  PAIN:  Are you having pain? Yes NPRS scale: 2/10  Pain location: feels like a pebble at the tailbone; left low back and lateral hip  Aggravating factors: sitting > 1 hour sharp pains 7/10; walking inclines will hurt  Relieving factors: swims; stretching midback; yoga every morning; usually walking ok on level ground  PRECAUTIONS: None  WEIGHT BEARING RESTRICTIONS: No  FALLS:  Has patient fallen in last 6 months? No  LIVING ENVIRONMENT: Lives with: lives with their family Lives in: House/apartment   OCCUPATION: standing desk, tries to limit sitting  PLOF: Independent  PATIENT GOALS: reduce my pain; more functional I can barely sit at all; wants to travel;  mid June airplane [place];  need to be able to walk normal speed keep up with daughter 49 year old  NEXT MD VISIT: nothing scheduled  OBJECTIVE:   DIAGNOSTIC FINDINGS:  X-rays Mild degen lower thoracic  PATIENT SURVEYS:  Modified Oswestry: 40% moderate disability  COGNITION: Overall cognitive status: Within functional limits for tasks assessed       MUSCLE LENGTH: Hamstrings: Right 75 deg; Left 75 deg Thomas test: Right 10 deg; Left 10 deg  POSTURE: No Significant postural limitations  PALPATION: Decreased QL mobility  LUMBAR ROM:   AROM eval  Flexion Fingertips to shoes with feet together  Extension 20  Right lateral flexion 20  Left lateral flexion 20  Right rotation    Left rotation    (Blank rows = not tested)  LOWER EXTREMITY ROM:   decreased right hip internal rotation ROM 5 degrees; left internal rotation 12 degrees  TRUNK STRENGTH:  Decreased activation of transverse abdominus muscles; abdominals 4/5; decreased activation of lumbar multifidi; trunk extensors 4/5  Bird dog 4/5; modified plank 4/5; bent knee lift and lower= abs 4/5  LOWER EXTREMITY MMT:    MMT Right eval Left eval  Hip flexion 5 5  Hip extension 4 4  Hip abduction 4 4  Hip adduction    Hip internal rotation    Hip external rotation 4 4  Knee flexion    Knee extension 5 5  Ankle dorsiflexion    Ankle plantarflexion    Ankle inversion    Ankle eversion     (Blank rows = not tested)  LUMBAR SPECIAL TESTS:  Straight leg raise test: Negative and Single leg stance test: Negative   GAIT: Comments: decreased gait speed  TODAY'S TREATMENT:            DATE: 09/11/2022 Modified plank on knees 5x5 sec (pt encouraged to work on increased tolerance and holding of core muscles) Childs pose x20 sec Quadruped rocking x10 Quadruped partial range donkey kicks 2x10 bilat Side-lying bent knee SI joint slide 2x10 bilat Side-lying clamshells 2x10 bilat Abdominal brace: with hand to opp knee isometric push 10x5 sec hold (cuing to maintain contraction for 5 sec instead of a fast count) Manual therapy: KT star pattern over sacral region and 2 "I" strips to bilateral lumbar paraspinals; soft tissue mobilization to left gluteals, piriformis and bil lumbar paraspinals Trigger Point Dry-Needling  Treatment instructions: Expect mild to moderate muscle soreness. S/S of pneumothorax if dry needled over a lung field, and to seek immediate medical attention should they occur. Patient verbalized understanding of these instructions and education. Patient Consent Given: Yes Education handout provided: Yes Muscles treated: bilat gluteals/piriformis and bil lumbar multifidi Electrical stimulation  performed: No Parameters: N/A Treatment response/outcome: Utilized skilled palpation to locate bony landmarks and identify trigger points.  Able to illicit twitch response and  muscle elongation.   DATE: 5/9 Therapeutic ex:  Review of modified planks with pt demo of good technique Discussed quadruped hover alternative to plank Trial of ex's for coccygeal pain:  childs pose or quadruped rocking (feels good), quadruped tail wags (feels irritating so discontinued); standing hip hinge while holding back of the chair with hips internally rotated (feels good) Manual therapy:  soft tissue mobilization to left QL and bil lumbar paraspinals McConnell tape applied in prone:  3 inch strip (white and brown tape) from coccygeal region pulled superiorly Patient instructed to remove before bedtime tonight Trigger Point Dry-Needling  Treatment instructions: Expect mild to moderate muscle soreness. S/S of pneumothorax if dry needled over a lung field, and to seek immediate medical attention should they occur. Patient verbalized understanding of these instructions and education.  Patient Consent Given: Yes Education handout provided: Yes Muscles treated: left QL in sidelying and bil lumbar multifidi in prone Electrical stimulation performed: yes Parameters: 1.0 ma 8 min to bil lumbar multifidi Treatment response/outcome: improved soft tissue mobility and decreased tender points                                                                                                                         DATE: 5/1 Therapeutic ex:  supine piriformis stretch 20 sec hold Abdominal brace: with hand to opp knee isometric push 10x;  bent knee lift and lower 10x Sidelying clam left 10x Modified plank Hip hinge using golf club to maintain 3 points of contact Manual therapy: KT star pattern; soft tissue mobilization to left gluteals, piriformis and bil lumbar paraspinals Trigger Point Dry-Needling  Treatment  instructions: Expect mild to moderate muscle soreness. S/S of pneumothorax if dry needled over a lung field, and to seek immediate medical attention should they occur. Patient verbalized understanding of these instructions and education.  Patient Consent Given: Yes Education handout provided: Yes Muscles treated: left gluteals, piriformis in sidelying and bil lumbar multifidi Electrical stimulation performed: No Parameters: N/A Treatment response/outcome: improved soft tissue mobility and decreased tender points     PATIENT EDUCATION:  Education details: Educated patient on anatomy and physiology of current symptoms, prognosis, plan of care as well as initial self care strategies to promote recovery Use of lumbar roll when sitting to avoid sacral sitting Person educated: Patient Education method: Explanation Education comprehension: verbalized understanding  HOME EXERCISE PROGRAM: Access Code: [email or web address] URL: [email or web address] Date: 09/05/2022 Prepared by: Lavinia Sharps  Exercises - Supine Piriformis Stretch (Mirrored)  - 1 x daily - 7 x weekly - 1 sets - 3 reps - 20 hold - Clamshell  - 1 x daily - 7 x weekly - 1 sets - 10 reps - Hooklying Isometric Hip Flexion  - 1 x daily - 7 x weekly - 1 sets - 5 reps - Hooklying Sequential Leg March and Lower  - 1 x daily - 7 x weekly - 1 sets - 10 reps - Plank on Knees  - 1 x daily -  7 x weekly - 1 sets - 10 reps - Quadruped Rocking Slow  - 1 x daily - 7 x weekly - 1 sets - 10 reps - Standing Hip Internal and External Rotation AROM  - 1 x daily - 7 x weekly - 1 sets - 10 reps  ASSESSMENT:  CLINICAL IMPRESSION: Ms Lownes presents to skilled PT reporting similar pain levels to last session but overall 10% improvement since initial evaluation.  Patient requires cuing during exercises for improved technique and holding of muscle contraction.  Patient continues to report some improvement with dry needling and manual therapy and  stated that she felt more benefit from kinesiotape vs McConnel tape.  Patient educated on the potential benefits of pelvic PT and she was agreeable, so she was placed on waiting list to see a Pelvic PT specialist in coming weeks.  Patient continues to require skilled PT to progress towards goal related activities.    OBJECTIVE IMPAIRMENTS: decreased activity tolerance, decreased mobility, difficulty walking, decreased ROM, decreased strength, increased fascial restrictions, impaired perceived functional ability, and pain.   ACTIVITY LIMITATIONS: sitting, standing, sleeping, and locomotion level  PARTICIPATION LIMITATIONS: driving, community activity, and occupation  PERSONAL FACTORS: Past/current experiences and Time since onset of injury/illness/exacerbation are also affecting patient's functional outcome.   REHAB POTENTIAL: Good  CLINICAL DECISION MAKING: Stable/uncomplicated  EVALUATION COMPLEXITY: Low   GOALS: Goals reviewed with patient? Yes  SHORT TERM GOALS: Target date: 09/24/2022      The patient will demonstrate knowledge of basic self care strategies and exercises to promote healing   Baseline: Goal status: IN PROGRESS  2.  The patient will report a 30% improvement in pain levels with functional activities which are currently difficult including sitting for work and traveling  (upcoming trip to [place]) Baseline:  Goal status: IN PROGRESS (reports 10% improvement on 09/11/22)  3.  The patient will have improved hip strength to at least 4+/5 needed for standing, walking inclines, walking longer distances  Baseline:  Goal status: INITIAL  4. Improved right hip internal rotation ROM to 15 degrees needed for improved  Baseline:  Goal status: INITIAL   LONG TERM GOALS: Target date: 10/22/2022    The patient will be independent in a safe self progression of a home exercise program to promote further recovery of function  Baseline:  Goal status: INITIAL  2.  The patient  will report a 70% improvement in pain levels with functional activities which are currently difficult including sitting Baseline:  Goal status: INITIAL  3.  The patient will have improved trunk flexor and extensor muscle strength to at least 4+/5 needed for lifting medium weight objects such as grocery bags, laundry and luggage  Baseline:  Goal status: INITIAL  4.  Improved sitting tolerance for flight to [place] and gait stamina for walking moderate community distances for travel Baseline:  Goal status: INITIAL  5.  Modified Oswestry score improved to 30% indicating improved function with less pain Baseline:  Goal status: INITIAL    PLAN:  PT FREQUENCY: 2x/week  PT DURATION: 8 weeks  PLANNED INTERVENTIONS: Therapeutic exercises, Therapeutic activity, Neuromuscular re-education, Patient/Family education, Self Care, Joint mobilization, Aquatic Therapy, Dry Needling, Spinal mobilization, Cryotherapy, Moist heat, Taping, Traction, Ionotophoresis 4mg /ml Dexamethasone, Manual therapy, and Re-evaluation.  PLAN FOR NEXT SESSION: assess response to DN of QL and DN with ES to lumbar multifidi; check response to McConnell taping of coccyx;  bil hip mobs; hip internal rotation ROM;  QL, bil glute strengthening; core strengthening progress  to standing; review and progress hip hinge    Aveer Bartow, PT 09/11/22 1:28 PM  [facility] [address], [address] [place], [place] [address] Phone # [phone] Fax [phone]

## 2022-09-13 ENCOUNTER — Encounter: Payer: Self-pay | Admitting: Rehabilitative and Restorative Service Providers"

## 2022-09-13 ENCOUNTER — Ambulatory Visit: Payer: 59 | Admitting: Rehabilitative and Restorative Service Providers"

## 2022-09-13 DIAGNOSIS — M5459 Other low back pain: Secondary | ICD-10-CM | POA: Diagnosis not present

## 2022-09-13 DIAGNOSIS — R531 Weakness: Secondary | ICD-10-CM | POA: Diagnosis not present

## 2022-09-13 NOTE — Therapy (Signed)
OUTPATIENT PHYSICAL THERAPY TREATMENT NOTE   Patient Name: Kathryn Cohen MRN: [id number] DOB:[birth year], 47 y.o., female Today's Date: 08/27/22  END OF SESSION:  PT End of Session - 09/13/22 0806     Visit Number 5    Date for PT Re-Evaluation 10/22/22    Authorization Type [facility] [employer]    PT Start Time 0804    PT Stop Time 0842    PT Time Calculation (min) 38 min    Activity Tolerance Patient tolerated treatment well    Behavior During Therapy WFL for tasks assessed/performed             Past Medical History:  Diagnosis Date   Allergy    Asthma    Back pain    Breast hypertrophy    Laceration of hand    rt long, ring and small fingers   Neck pain    Past Surgical History:  Procedure Laterality Date   BREAST REDUCTION SURGERY Bilateral 03/14/2017   Procedure: BILATERAL MAMMARY REDUCTION  (BREAST);  Surgeon: Glenna Fellows, MD;  Location: [facility];  Service: Plastics;  Laterality: Bilateral;   NERVE, TENDON AND ARTERY REPAIR Right 02/02/2015   Procedure: RIGHT LONG RING SMALL FINGER REPAIR NERVE, TENDON AND ARTERY REPAIR;  Surgeon: Betha Loa, MD;  Location: [facility];  Service: Orthopedics;  Laterality: Right;   REDUCTION MAMMAPLASTY Bilateral 02/2017   Patient Active Problem List   Diagnosis Date Noted   Asthma 02/24/2011   Allergic rhinitis 02/24/2011    PCP: Polite,Ronald MD  REFERRING PROVIDER: Polite,Ronald MD  REFERRING DIAG: low back pain  Rationale for Evaluation and Treatment: Rehabilitation  THERAPY DIAG:  Back pain; weakness  ONSET DATE: 04/29/2022  SUBJECTIVE:                                                                                                                                                                                           SUBJECTIVE STATEMENT: Patient reports that she felt really great after last PT session and she did not take her pain medication.  She states that yesterday,  she was not feeling pain, but a little irritation and did not have to take her pain medication yesterday.  Patient able to go 2 days without pain medication.  "I'm still really struggling with the sitting; sometimes it feels okay when I'm sitting, but it hurts when I get up."  PERTINENT HISTORY:  Goes by Kathryn Cohen Ongoing problem related to a fall off horse at 48 years old; resolved but lifted something heavy triggered an exacerbation; then college age back spasms; got PT and acupuncture (bad experience lots  of providers, lying lifted leg with weight worsened) Recent illness lying down flared up back Lots of recent travel may have aggravated Patient unable to sit for the evaluation (standing more comfortable) 47 yo daughter PAIN:  PAIN:  Are you having pain? Yes NPRS scale: 1-2/10  Pain location: feels like a pebble at the tailbone; left low back and lateral hip  Aggravating factors: sitting > 1 hour sharp pains 7/10; walking inclines will hurt  Relieving factors: swims; stretching midback; yoga every morning; usually walking ok on level ground  PRECAUTIONS: None  WEIGHT BEARING RESTRICTIONS: No  FALLS:  Has patient fallen in last 6 months? No  LIVING ENVIRONMENT: Lives with: lives with their family Lives in: House/apartment   OCCUPATION: standing desk, tries to limit sitting  PLOF: Independent  PATIENT GOALS: reduce my pain; more functional I can barely sit at all; wants to travel;  mid June airplane [place];  need to be able to walk normal speed keep up with daughter 40 year old  NEXT MD VISIT: nothing scheduled  OBJECTIVE:   DIAGNOSTIC FINDINGS:  X-rays Mild degen lower thoracic  PATIENT SURVEYS:  Modified Oswestry: 40% moderate disability  COGNITION: Overall cognitive status: Within functional limits for tasks assessed       MUSCLE LENGTH: Hamstrings: Right 75 deg; Left 75 deg Thomas test: Right 10 deg; Left 10 deg  POSTURE: No Significant postural  limitations  PALPATION: Decreased QL mobility  LUMBAR ROM:   AROM eval  Flexion Fingertips to shoes with feet together  Extension 20  Right lateral flexion 20  Left lateral flexion 20  Right rotation   Left rotation    (Blank rows = not tested)  LOWER EXTREMITY ROM:   decreased right hip internal rotation ROM 5 degrees; left internal rotation 12 degrees  TRUNK STRENGTH:  Decreased activation of transverse abdominus muscles; abdominals 4/5; decreased activation of lumbar multifidi; trunk extensors 4/5  Bird dog 4/5; modified plank 4/5; bent knee lift and lower= abs 4/5  LOWER EXTREMITY MMT:    MMT Right eval Left eval  Hip flexion 5 5  Hip extension 4 4  Hip abduction 4 4  Hip adduction    Hip internal rotation    Hip external rotation 4 4  Knee flexion    Knee extension 5 5  Ankle dorsiflexion    Ankle plantarflexion    Ankle inversion    Ankle eversion     (Blank rows = not tested)  LUMBAR SPECIAL TESTS:  Straight leg raise test: Negative and Single leg stance test: Negative   GAIT: Comments: decreased gait speed  TODAY'S TREATMENT:            DATE: 09/13/2022 6 minutes ambulation outside x1411 ft Modified plank on knees 3x 10 sec (with improved core contraction noted today) Childs pose x20 sec Quadruped rocking x10 Quadruped partial range donkey kicks 2x10 bilat Quadruped Academic librarian x5 bilat Childs pose x20 sec Side-lying bent knee SI joint slide 2x10 bilat Side-lying clamshells 2x10 bilat Abdominal brace: with hand to opp knee isometric push 10x5 sec hold (cuing to maintain contraction for 5 sec instead of a fast count) Supine posterior pelvic tilt x10 Standing with lateral hip shift towards wall x10 bilat Standing hip flexor with lateral overhead reach for QL stretch 3x10 sec bilat   DATE: 09/11/2022 Modified plank on knees 5x5 sec (pt encouraged to work on increased tolerance and holding of core muscles) Childs pose x20 sec Quadruped rocking  x10 Quadruped partial range  donkey kicks 2x10 bilat Side-lying bent knee SI joint slide 2x10 bilat Side-lying clamshells 2x10 bilat Abdominal brace: with hand to opp knee isometric push 10x5 sec hold (cuing to maintain contraction for 5 sec instead of a fast count) Manual therapy: KT star pattern over sacral region and 2 "I" strips to bilateral lumbar paraspinals; soft tissue mobilization to left gluteals, piriformis and bil lumbar paraspinals Trigger Point Dry-Needling  Treatment instructions: Expect mild to moderate muscle soreness. S/S of pneumothorax if dry needled over a lung field, and to seek immediate medical attention should they occur. Patient verbalized understanding of these instructions and education. Patient Consent Given: Yes Education handout provided: Yes Muscles treated: bilat gluteals/piriformis and bil lumbar multifidi Electrical stimulation performed: No Parameters: N/A Treatment response/outcome: Utilized skilled palpation to locate bony landmarks and identify trigger points.  Able to illicit twitch response and muscle elongation.   DATE: 5/9 Therapeutic ex:  Review of modified planks with pt demo of good technique Discussed quadruped hover alternative to plank Trial of ex's for coccygeal pain:  childs pose or quadruped rocking (feels good), quadruped tail wags (feels irritating so discontinued); standing hip hinge while holding back of the chair with hips internally rotated (feels good) Manual therapy:  soft tissue mobilization to left QL and bil lumbar paraspinals McConnell tape applied in prone:  3 inch strip (white and brown tape) from coccygeal region pulled superiorly Patient instructed to remove before bedtime tonight Trigger Point Dry-Needling  Treatment instructions: Expect mild to moderate muscle soreness. S/S of pneumothorax if dry needled over a lung field, and to seek immediate medical attention should they occur. Patient verbalized understanding of these  instructions and education.  Patient Consent Given: Yes Education handout provided: Yes Muscles treated: left QL in sidelying and bil lumbar multifidi in prone Electrical stimulation performed: yes Parameters: 1.0 ma 8 min to bil lumbar multifidi Treatment response/outcome: improved soft tissue mobility and decreased tender points                                                                                                                            PATIENT EDUCATION:  Education details: Educated patient on anatomy and physiology of current symptoms, prognosis, plan of care as well as initial self care strategies to promote recovery Use of lumbar roll when sitting to avoid sacral sitting Person educated: Patient Education method: Explanation Education comprehension: verbalized understanding  HOME EXERCISE PROGRAM: Access Code: [email or web address] URL: [email or web address] Date: 09/13/2022 Prepared by: Clydie Braun Carolos Fecher  Exercises - Supine Piriformis Stretch (Mirrored)  - 1 x daily - 7 x weekly - 1 sets - 3 reps - 20 hold - Clamshell  - 1 x daily - 7 x weekly - 1 sets - 10 reps - Hooklying Isometric Hip Flexion  - 1 x daily - 7 x weekly - 1 sets - 5 reps - Hooklying Sequential Leg March and Lower  - 1 x daily - 7 x weekly - 1 sets - 10  reps - Plank on Knees  - 1 x daily - 7 x weekly - 1 sets - 10 reps - Quadruped Hip Extension Kicks  - 1 x daily - 7 x weekly - 2 sets - 10 reps - Quadruped Rocking Slow  - 1 x daily - 7 x weekly - 1 sets - 10 reps - Standing Hip Internal and External Rotation AROM  - 1 x daily - 7 x weekly - 1 sets - 10 reps - Half Kneeling Hip Flexor Stretch with Tri-Planar Reach  - 1 x daily - 7 x weekly - 3 sets - 10 reps  ASSESSMENT:  CLINICAL IMPRESSION: Ms Gambler presents to skilled PT reporting that she was having some overall decreased pain and able to go without taking pain medication for 2 days.  Patient with good response overall to exercises during  session and reports some muscle fatigue, but not having the increased pain that she did initially.  Patient reports that kinesiotaping has been helping, so educated patient in how to perform this at home with the assistance of her daughter and had patient demonstrate the proper stretching and application of tape to this PT's arm.  Patient able to properly return demonstrate.  Patient provided with updated HEP during session to progress at home.  Patient continues to require skilled PT to progress towards decreased pain with functional activities.    OBJECTIVE IMPAIRMENTS: decreased activity tolerance, decreased mobility, difficulty walking, decreased ROM, decreased strength, increased fascial restrictions, impaired perceived functional ability, and pain.   ACTIVITY LIMITATIONS: sitting, standing, sleeping, and locomotion level  PARTICIPATION LIMITATIONS: driving, community activity, and occupation  PERSONAL FACTORS: Past/current experiences and Time since onset of injury/illness/exacerbation are also affecting patient's functional outcome.   REHAB POTENTIAL: Good  CLINICAL DECISION MAKING: Stable/uncomplicated  EVALUATION COMPLEXITY: Low   GOALS: Goals reviewed with patient? Yes  SHORT TERM GOALS: Target date: 09/24/2022      The patient will demonstrate knowledge of basic self care strategies and exercises to promote healing   Baseline: Goal status: IN PROGRESS  2.  The patient will report a 30% improvement in pain levels with functional activities which are currently difficult including sitting for work and traveling  (upcoming trip to [place]) Baseline:  Goal status: IN PROGRESS (reports 10% improvement on 09/11/22)  3.  The patient will have improved hip strength to at least 4+/5 needed for standing, walking inclines, walking longer distances  Baseline:  Goal status: INITIAL  4. Improved right hip internal rotation ROM to 15 degrees needed for improved  Baseline:  Goal status:  INITIAL   LONG TERM GOALS: Target date: 10/22/2022    The patient will be independent in a safe self progression of a home exercise program to promote further recovery of function  Baseline:  Goal status: INITIAL  2.  The patient will report a 70% improvement in pain levels with functional activities which are currently difficult including sitting Baseline:  Goal status: INITIAL  3.  The patient will have improved trunk flexor and extensor muscle strength to at least 4+/5 needed for lifting medium weight objects such as grocery bags, laundry and luggage  Baseline:  Goal status: INITIAL  4.  Improved sitting tolerance for flight to [place] and gait stamina for walking moderate community distances for travel Baseline:  Goal status: INITIAL  5.  Modified Oswestry score improved to 30% indicating improved function with less pain Baseline:  Goal status: INITIAL    PLAN:  PT FREQUENCY: 2x/week  PT  DURATION: 8 weeks  PLANNED INTERVENTIONS: Therapeutic exercises, Therapeutic activity, Neuromuscular re-education, Patient/Family education, Self Care, Joint mobilization, Aquatic Therapy, Dry Needling, Spinal mobilization, Cryotherapy, Moist heat, Taping, Traction, Ionotophoresis 4mg /ml Dexamethasone, Manual therapy, and Re-evaluation.  PLAN FOR NEXT SESSION: assess response to DN of QL and DN with ES to lumbar multifidi; check response to McConnell taping of coccyx;  bil hip mobs; hip internal rotation ROM;  QL, bil glute strengthening; core strengthening progress to standing; review and progress hip hinge    Onyx Schirmer, PT 09/13/22 9:08 AM  [facility] [address] [place], [place] [address] Phone # [phone] Fax [phone]

## 2022-09-17 NOTE — Therapy (Addendum)
OUTPATIENT PHYSICAL THERAPY TREATMENT NOTE   Patient Name: Kathryn Cohen MRN: [id number] DOB:[birth year], 47 y.o., female Today's Date: 08/27/22  END OF SESSION:  PT End of Session - 09/18/22 0954     Visit Number 6    Date for PT Re-Evaluation 10/22/22    Authorization Type [facility] [employer]    PT Start Time 0848    PT Stop Time 0933    PT Time Calculation (min) 45 min    Activity Tolerance Patient tolerated treatment well    Behavior During Therapy WFL for tasks assessed/performed              Past Medical History:  Diagnosis Date   Allergy    Asthma    Back pain    Breast hypertrophy    Laceration of hand    rt long, ring and small fingers   Neck pain    Past Surgical History:  Procedure Laterality Date   BREAST REDUCTION SURGERY Bilateral 03/14/2017   Procedure: BILATERAL MAMMARY REDUCTION  (BREAST);  Surgeon: Glenna Fellows, MD;  Location: [facility];  Service: Plastics;  Laterality: Bilateral;   NERVE, TENDON AND ARTERY REPAIR Right 02/02/2015   Procedure: RIGHT LONG RING SMALL FINGER REPAIR NERVE, TENDON AND ARTERY REPAIR;  Surgeon: Betha Loa, MD;  Location: [facility];  Service: Orthopedics;  Laterality: Right;   REDUCTION MAMMAPLASTY Bilateral 02/2017   Patient Active Problem List   Diagnosis Date Noted   Asthma 02/24/2011   Allergic rhinitis 02/24/2011    PCP: Polite,Ronald MD  REFERRING PROVIDER: Polite,Ronald MD  REFERRING DIAG: low back pain  Rationale for Evaluation and Treatment: Rehabilitation  THERAPY DIAG:  Back pain; weakness  ONSET DATE: 04/29/2022  SUBJECTIVE:                                                                                                                                                                                           SUBJECTIVE STATEMENT: Pt states sitting is really hard and has gotten worse.  Patient reports that she felt really great after last PT session and she did  not take her pain medication.  She states that yesterday, she was not feeling pain, but a little irritation and did not have to take her pain medication yesterday.  Patient able to go 2 days without pain medication.  "I'm still really struggling with the sitting; sometimes it feels okay when I'm sitting, but it hurts when I get up."  PERTINENT HISTORY:  Goes by [name] Ongoing problem related to a fall off horse at 47 years old; resolved but lifted something heavy triggered an exacerbation;  then college age back spasms; got PT and acupuncture (bad experience lots of providers, lying lifted leg with weight worsened) Recent illness lying down flared up back Lots of recent travel may have aggravated Patient unable to sit for the evaluation (standing more comfortable) 35 yo daughter PAIN:  PAIN:  Are you having pain? Yes NPRS scale: 2/10  Pain location: feels like a pebble at the tailbone; left low back and lateral hip  Aggravating factors: sitting > 1 hour sharp pains 7/10; walking inclines will hurt  Relieving factors: swims; stretching midback; yoga every morning; usually walking ok on level ground  PRECAUTIONS: None  WEIGHT BEARING RESTRICTIONS: No  FALLS:  Has patient fallen in last 6 months? No  LIVING ENVIRONMENT: Lives with: lives with their family Lives in: House/apartment   OCCUPATION: standing desk, tries to limit sitting  PLOF: Independent  PATIENT GOALS: reduce my pain; more functional I can barely sit at all; wants to travel;  mid June airplane [place];  need to be able to walk normal speed keep up with daughter 22 year old  NEXT MD VISIT: nothing scheduled  OBJECTIVE:   DIAGNOSTIC FINDINGS:  X-rays Mild degen lower thoracic  PATIENT SURVEYS:  Modified Oswestry: 40% moderate disability  COGNITION: Overall cognitive status: Within functional limits for tasks assessed       MUSCLE LENGTH: Hamstrings: Right 75 deg; Left 75 deg Thomas test: Right 10 deg; Left 10  deg  POSTURE: No Significant postural limitations  PALPATION: Decreased QL mobility  LUMBAR ROM:   AROM eval  Flexion Fingertips to shoes with feet together  Extension 20  Right lateral flexion 20  Left lateral flexion 20  Right rotation   Left rotation    (Blank rows = not tested)  LOWER EXTREMITY ROM:   decreased right hip internal rotation ROM 5 degrees; left internal rotation 12 degrees  TRUNK STRENGTH:  Decreased activation of transverse abdominus muscles; abdominals 4/5; decreased activation of lumbar multifidi; trunk extensors 4/5  Bird dog 4/5; modified plank 4/5; bent knee lift and lower= abs 4/5  LOWER EXTREMITY MMT:    MMT Right eval Left eval  Hip flexion 5 5  Hip extension 4 4  Hip abduction 4 4  Hip adduction    Hip internal rotation    Hip external rotation 4 4  Knee flexion    Knee extension 5 5  Ankle dorsiflexion    Ankle plantarflexion    Ankle inversion    Ankle eversion     (Blank rows = not tested)  LUMBAR SPECIAL TESTS:  Straight leg raise test: Negative and Single leg stance test: Negative   GAIT: Comments: decreased gait speed  TODAY'S TREATMENT:           DATE: 09/18/2022 Manual Trigger Point Dry-Needling  Treatment instructions: Expect mild to moderate muscle soreness. S/S of pneumothorax if dry needled over a lung field, and to seek immediate medical attention should they occur. Patient verbalized understanding of these instructions and education.  Patient Consent Given: Yes Education handout provided: Yes Muscles treated: bil gluteals, lumbar paraspinals Electrical stimulation performed: No Parameters: N/A Treatment response/outcome: increased soft tissue length  Patient confirms identification and approves physical therapist to perform internal soft tissue work  - rectally performed puborectalis and coccygeus release bil  Exercise: Cat cow Child pose with side bend Child pose hips elevated  DATE: 09/13/2022 6 minutes  ambulation outside x1411 ft Modified plank on knees 3x 10 sec (with improved core contraction noted today) Marjo Bicker  pose x20 sec Quadruped rocking x10 Quadruped partial range donkey kicks 2x10 bilat Quadruped Academic librarian x5 bilat Childs pose x20 sec Side-lying bent knee SI joint slide 2x10 bilat Side-lying clamshells 2x10 bilat Abdominal brace: with hand to opp knee isometric push 10x5 sec hold (cuing to maintain contraction for 5 sec instead of a fast count) Supine posterior pelvic tilt x10 Standing with lateral hip shift towards wall x10 bilat Standing hip flexor with lateral overhead reach for QL stretch 3x10 sec bilat   DATE: 09/11/2022 Modified plank on knees 5x5 sec (pt encouraged to work on increased tolerance and holding of core muscles) Childs pose x20 sec Quadruped rocking x10 Quadruped partial range donkey kicks 2x10 bilat Side-lying bent knee SI joint slide 2x10 bilat Side-lying clamshells 2x10 bilat Abdominal brace: with hand to opp knee isometric push 10x5 sec hold (cuing to maintain contraction for 5 sec instead of a fast count) Manual therapy: KT star pattern over sacral region and 2 "I" strips to bilateral lumbar paraspinals; soft tissue mobilization to left gluteals, piriformis and bil lumbar paraspinals Trigger Point Dry-Needling  Treatment instructions: Expect mild to moderate muscle soreness. S/S of pneumothorax if dry needled over a lung field, and to seek immediate medical attention should they occur. Patient verbalized understanding of these instructions and education. Patient Consent Given: Yes Education handout provided: Yes Muscles treated: bilat gluteals/piriformis and bil lumbar multifidi Electrical stimulation performed: No Parameters: N/A Treatment response/outcome: Utilized skilled palpation to locate bony landmarks and identify trigger points.  Able to illicit twitch response and muscle elongation.   DATE: 5/9 Therapeutic ex:  Review of modified  planks with pt demo of good technique Discussed quadruped hover alternative to plank Trial of ex's for coccygeal pain:  childs pose or quadruped rocking (feels good), quadruped tail wags (feels irritating so discontinued); standing hip hinge while holding back of the chair with hips internally rotated (feels good) Manual therapy:  soft tissue mobilization to left QL and bil lumbar paraspinals McConnell tape applied in prone:  3 inch strip (white and brown tape) from coccygeal region pulled superiorly Patient instructed to remove before bedtime tonight Trigger Point Dry-Needling  Treatment instructions: Expect mild to moderate muscle soreness. S/S of pneumothorax if dry needled over a lung field, and to seek immediate medical attention should they occur. Patient verbalized understanding of these instructions and education.  Patient Consent Given: Yes Education handout provided: Yes Muscles treated: left QL in sidelying and bil lumbar multifidi in prone Electrical stimulation performed: yes Parameters: 1.0 ma 8 min to bil lumbar multifidi Treatment response/outcome: improved soft tissue mobility and decreased tender points                                                                                                                            PATIENT EDUCATION:  Education details: Educated patient on anatomy and physiology of current symptoms, prognosis, plan of care as well as initial self care strategies to promote recovery Use  of lumbar roll when sitting to avoid sacral sitting Person educated: Patient Education method: Explanation Education comprehension: verbalized understanding  HOME EXERCISE PROGRAM: Access Code: [email or web address] URL: [email or web address] Date: 09/13/2022 Prepared by: Clydie Braun Menke  Exercises - Supine Piriformis Stretch (Mirrored)  - 1 x daily - 7 x weekly - 1 sets - 3 reps - 20 hold - Clamshell  - 1 x daily - 7 x weekly - 1 sets - 10 reps - Hooklying  Isometric Hip Flexion  - 1 x daily - 7 x weekly - 1 sets - 5 reps - Hooklying Sequential Leg March and Lower  - 1 x daily - 7 x weekly - 1 sets - 10 reps - Plank on Knees  - 1 x daily - 7 x weekly - 1 sets - 10 reps - Quadruped Hip Extension Kicks  - 1 x daily - 7 x weekly - 2 sets - 10 reps - Quadruped Rocking Slow  - 1 x daily - 7 x weekly - 1 sets - 10 reps - Standing Hip Internal and External Rotation AROM  - 1 x daily - 7 x weekly - 1 sets - 10 reps - Half Kneeling Hip Flexor Stretch with Tri-Planar Reach  - 1 x daily - 7 x weekly - 3 sets - 10 reps  ASSESSMENT:  CLINICAL IMPRESSION: Ms Reising presents to skilled PT reporting that she has been feeling better since beginning skilled PT and the dry needling helped last week.  Pt still having a hard time with sitting. Pt was seen by pelvic PT to assess muscles of the pelvic floor. Pt has tension in puborectalis, tenderness of the coccygeal ligament, tight and tender bil coccygeus muscles.  Pt responded well to manual release with increased soft tissue length and dry needling with a lot of twitch in Rt lumbar muscles.  Patient provided with updated HEP during session to progress at home with increased coccyx mobility.  Patient continues to require skilled PT to progress towards decreased pain with functional activities.    OBJECTIVE IMPAIRMENTS: decreased activity tolerance, decreased mobility, difficulty walking, decreased ROM, decreased strength, increased fascial restrictions, impaired perceived functional ability, and pain.   ACTIVITY LIMITATIONS: sitting, standing, sleeping, and locomotion level  PARTICIPATION LIMITATIONS: driving, community activity, and occupation  PERSONAL FACTORS: Past/current experiences and Time since onset of injury/illness/exacerbation are also affecting patient's functional outcome.   REHAB POTENTIAL: Good  CLINICAL DECISION MAKING: Stable/uncomplicated  EVALUATION COMPLEXITY: Low   GOALS: Goals  reviewed with patient? Yes  SHORT TERM GOALS: Target date: 09/24/2022    Updated (09/18/22)   The patient will demonstrate knowledge of basic self care strategies and exercises to promote healing   Baseline: Goal status: MET  2.  The patient will report a 30% improvement in pain levels with functional activities which are currently difficult including sitting for work and traveling  (upcoming trip to [place]) Baseline:  Goal status: IN PROGRESS (reports 10% improvement on 09/11/22)  3.  The patient will have improved hip strength to at least 4+/5 needed for standing, walking inclines, walking longer distances  Baseline:  Goal status: INITIAL  4. Improved right hip internal rotation ROM to 15 degrees needed for improved  Baseline:  Goal status: INITIAL   LONG TERM GOALS: Target date: 10/22/2022    The patient will be independent in a safe self progression of a home exercise program to promote further recovery of function  Baseline:  Goal status: INITIAL  2.  The patient will report a  70% improvement in pain levels with functional activities which are currently difficult including sitting Baseline:  Goal status: INITIAL  3.  The patient will have improved trunk flexor and extensor muscle strength to at least 4+/5 needed for lifting medium weight objects such as grocery bags, laundry and luggage  Baseline:  Goal status: INITIAL  4.  Improved sitting tolerance for flight to [place] and gait stamina for walking moderate community distances for travel Baseline:  Goal status: INITIAL  5.  Modified Oswestry score improved to 30% indicating improved function with less pain Baseline:  Goal status: INITIAL    PLAN:  PT FREQUENCY: 2x/week  PT DURATION: 8 weeks  PLANNED INTERVENTIONS: Therapeutic exercises, Therapeutic activity, Neuromuscular re-education, Patient/Family education, Self Care, Joint mobilization, Aquatic Therapy, Dry Needling, Spinal mobilization, Cryotherapy, Moist heat,  Taping, Traction, Ionotophoresis 4mg /ml Dexamethasone, Manual therapy, and Re-evaluation.  PLAN FOR NEXT SESSION: assess response to DN of QL and DN with ES to lumbar multifidi; check response to McConnell taping of coccyx;  bil hip mobs; hip internal rotation ROM;  QL, bil glute strengthening; core strengthening progress to standing; review and progress hip hinge  Continue with above and do pelvic floor soft tissue work and dry needling to coccyx with pelvic PT    Russella Dar, PT, DPT 09/18/22 9:55 AM   [facility] [address], [address] [place], [place] [address] Phone # [phone] Fax [phone]

## 2022-09-18 ENCOUNTER — Encounter: Payer: Self-pay | Admitting: Physical Therapy

## 2022-09-18 ENCOUNTER — Ambulatory Visit: Payer: 59 | Admitting: Physical Therapy

## 2022-09-18 DIAGNOSIS — M5459 Other low back pain: Secondary | ICD-10-CM | POA: Diagnosis not present

## 2022-09-18 DIAGNOSIS — R531 Weakness: Secondary | ICD-10-CM

## 2022-09-19 ENCOUNTER — Ambulatory Visit: Payer: 59 | Admitting: Physical Therapy

## 2022-09-25 ENCOUNTER — Ambulatory Visit: Payer: 59 | Admitting: Rehabilitative and Restorative Service Providers"

## 2022-09-25 ENCOUNTER — Encounter: Payer: Self-pay | Admitting: Rehabilitative and Restorative Service Providers"

## 2022-09-25 DIAGNOSIS — M5459 Other low back pain: Secondary | ICD-10-CM | POA: Diagnosis not present

## 2022-09-25 DIAGNOSIS — R531 Weakness: Secondary | ICD-10-CM | POA: Diagnosis not present

## 2022-09-25 NOTE — Therapy (Signed)
OUTPATIENT PHYSICAL THERAPY TREATMENT NOTE   Patient Name: Kathryn Cohen MRN: [id number] DOB:11-Sep-1975, 47 y.o., female Today's Date: 08/27/22  END OF SESSION:  PT End of Session - 09/25/22 0853     Visit Number 7    Date for PT Re-Evaluation 10/22/22    Authorization Type [facility] [employer]    PT Start Time 0845    PT Stop Time 0925    PT Time Calculation (min) 40 min    Activity Tolerance Patient tolerated treatment well    Behavior During Therapy WFL for tasks assessed/performed              Past Medical History:  Diagnosis Date   Allergy    Asthma    Back pain    Breast hypertrophy    Laceration of hand    rt long, ring and small fingers   Neck pain    Past Surgical History:  Procedure Laterality Date   BREAST REDUCTION SURGERY Bilateral 03/14/2017   Procedure: BILATERAL MAMMARY REDUCTION  (BREAST);  Surgeon: Glenna Fellows, MD;  Location: [facility];  Service: Plastics;  Laterality: Bilateral;   NERVE, TENDON AND ARTERY REPAIR Right 02/02/2015   Procedure: RIGHT LONG RING SMALL FINGER REPAIR NERVE, TENDON AND ARTERY REPAIR;  Surgeon: Betha Loa, MD;  Location: [facility];  Service: Orthopedics;  Laterality: Right;   REDUCTION MAMMAPLASTY Bilateral 02/2017   Patient Active Problem List   Diagnosis Date Noted   Asthma 02/24/2011   Allergic rhinitis 02/24/2011    PCP: Polite,Ronald MD  REFERRING PROVIDER: Polite,Ronald MD  REFERRING DIAG: low back pain  Rationale for Evaluation and Treatment: Rehabilitation  THERAPY DIAG:  Back pain; weakness  ONSET DATE: 04/29/2022  SUBJECTIVE:                                                                                                                                                                                           SUBJECTIVE STATEMENT: Patient reports that pelvic floor PT session seemed to help last session and she is hoping to get back on the pelvic PT schedule again  before her next scheduled appointment on 6/20  PERTINENT HISTORY:  Goes by [name] Ongoing problem related to a fall off horse at 47 years old; resolved but lifted something heavy triggered an exacerbation; then college age back spasms; got PT and acupuncture (bad experience lots of providers, lying lifted leg with weight worsened) Recent illness lying down flared up back Lots of recent travel may have aggravated Patient unable to sit for the evaluation (standing more comfortable) 49 yo daughter PAIN:  PAIN:  Are you having  pain? Yes NPRS scale: 1/10 currently.  2/10 with driving to clinic this morning Pain location: feels like a pebble at the tailbone; left low back and lateral hip  Aggravating factors: sitting > 1 hour sharp pains 7/10; walking inclines will hurt  Relieving factors: swims; stretching midback; yoga every morning; usually walking ok on level ground  PRECAUTIONS: None  WEIGHT BEARING RESTRICTIONS: No  FALLS:  Has patient fallen in last 6 months? No  LIVING ENVIRONMENT: Lives with: lives with their family Lives in: House/apartment   OCCUPATION: standing desk, tries to limit sitting  PLOF: Independent  PATIENT GOALS: reduce my pain; more functional I can barely sit at all; wants to travel;  mid June airplane [place];  need to be able to walk normal speed keep up with daughter 13 year old  NEXT MD VISIT: nothing scheduled  OBJECTIVE:   DIAGNOSTIC FINDINGS:  X-rays Mild degen lower thoracic  PATIENT SURVEYS:  Modified Oswestry: 40% moderate disability  COGNITION: Overall cognitive status: Within functional limits for tasks assessed       MUSCLE LENGTH: Hamstrings: Right 75 deg; Left 75 deg Thomas test: Right 10 deg; Left 10 deg  POSTURE: No Significant postural limitations  PALPATION: Decreased QL mobility  LUMBAR ROM:   AROM eval  Flexion Fingertips to shoes with feet together  Extension 20  Right lateral flexion 20  Left lateral flexion 20   Right rotation   Left rotation    (Blank rows = not tested)  LOWER EXTREMITY ROM:   decreased right hip internal rotation ROM 5 degrees; left internal rotation 12 degrees  09/25/2022: Bilateral hip internal rotation is 25 degrees  TRUNK STRENGTH:  Decreased activation of transverse abdominus muscles; abdominals 4/5; decreased activation of lumbar multifidi; trunk extensors 4/5  Bird dog 4/5; modified plank 4/5; bent knee lift and lower= abs 4/5  LOWER EXTREMITY MMT:    MMT Right eval Left eval  Hip flexion 5 5  Hip extension 4 4  Hip abduction 4 4  Hip adduction    Hip internal rotation    Hip external rotation 4 4  Knee flexion    Knee extension 5 5  Ankle dorsiflexion    Ankle plantarflexion    Ankle inversion    Ankle eversion     (Blank rows = not tested)  09/25/2022: Right hip flexion strength of 5-/5 Left hip flexion strength of 4+/5  LUMBAR SPECIAL TESTS:  Straight leg raise test: Negative and Single leg stance test: Negative   GAIT: Comments: decreased gait speed  TODAY'S TREATMENT:            DATE: 09/25/2022 Remeasure A/ROM and hip strength Modified plank on knees 3x 10 sec (with improved core contraction noted today) 3 way child's pose x20 sec each direction Quadruped rocking x10 Quadruped partial range donkey kicks 2x10 bilat Quadruped Academic librarian x5 bilat Quadruped cat/cow 2x10 Side-lying bent knee SI joint slide 2x10 bilat Side-lying clamshells 2x10 bilat Trigger Point Dry-Needling  Treatment instructions: Expect mild to moderate muscle soreness. S/S of pneumothorax if dry needled over a lung field, and to seek immediate medical attention should they occur. Patient verbalized understanding of these instructions and education. Patient Consent Given: Yes Education handout provided: Yes Muscles treated: bilat gluteals/piriformis and bil lumbar multifidi Electrical stimulation performed: No Parameters: N/A Treatment response/outcome: Utilized  skilled palpation to locate bony landmarks and identify trigger points.  Able to illicit twitch response and muscle elongation.   DATE: 09/18/2022 Manual Trigger Point Dry-Needling  Treatment instructions: Expect mild to moderate muscle soreness. S/S of pneumothorax if dry needled over a lung field, and to seek immediate medical attention should they occur. Patient verbalized understanding of these instructions and education.  Patient Consent Given: Yes Education handout provided: Yes Muscles treated: bil gluteals, lumbar paraspinals Electrical stimulation performed: No Parameters: N/A Treatment response/outcome: increased soft tissue length  Patient confirms identification and approves physical therapist to perform internal soft tissue work  - rectally performed puborectalis and coccygeus release bil  Exercise: Cat cow Child pose with side bend Child pose hips elevated  DATE: 09/13/2022 6 minutes ambulation outside x1411 ft Modified plank on knees 3x 10 sec (with improved core contraction noted today) Childs pose x20 sec Quadruped rocking x10 Quadruped partial range donkey kicks 2x10 bilat Quadruped Academic librarian x5 bilat Childs pose x20 sec Side-lying bent knee SI joint slide 2x10 bilat Side-lying clamshells 2x10 bilat Abdominal brace: with hand to opp knee isometric push 10x5 sec hold (cuing to maintain contraction for 5 sec instead of a fast count) Supine posterior pelvic tilt x10 Standing with lateral hip shift towards wall x10 bilat Standing hip flexor with lateral overhead reach for QL stretch 3x10 sec bilat                                                                                                                           PATIENT EDUCATION:  Education details: Educated patient on anatomy and physiology of current symptoms, prognosis, plan of care as well as initial self care strategies to promote recovery Use of lumbar roll when sitting to avoid sacral  sitting Person educated: Patient Education method: Explanation Education comprehension: verbalized understanding  HOME EXERCISE PROGRAM: Access Code: [email or web address] URL: [email or web address] Date: 09/25/2022 Prepared by: Clydie Braun Nikolas Casher  Exercises - Supine Piriformis Stretch (Mirrored)  - 1 x daily - 7 x weekly - 1 sets - 3 reps - 20 hold - Clamshell  - 1 x daily - 7 x weekly - 1 sets - 10 reps - Hooklying Isometric Hip Flexion  - 1 x daily - 7 x weekly - 1 sets - 5 reps - Hooklying Sequential Leg March and Lower  - 1 x daily - 7 x weekly - 1 sets - 10 reps - Plank on Knees  - 1 x daily - 7 x weekly - 1 sets - 10 reps - Quadruped Hip Extension Kicks  - 1 x daily - 7 x weekly - 2 sets - 10 reps - Quadruped Rocking Slow  - 1 x daily - 7 x weekly - 1 sets - 10 reps - Standing Hip Internal and External Rotation AROM  - 1 x daily - 7 x weekly - 1 sets - 10 reps - Half Kneeling Hip Flexor Stretch with Tri-Planar Reach  - 1 x daily - 7 x weekly - 3 sets - 10 reps - Quadruped Cat Cow  - 1 x daily - 7 x weekly - 3 sets - 10 reps -  Quadruped Thoracic Spine Extension  - 1 x daily - 7 x weekly - 3 sets - 10 reps - Quadruped Thoracic Lumbar Side Bend  - 1 x daily - 7 x weekly - 3 sets - 10 reps  ASSESSMENT:  CLINICAL IMPRESSION: Ms Claes presents to skilled PT reporting improvements noted since pelvic floor PT session.  Pt reports that she was able to go on the walk with her friend, as she did previously, but at a slower pace.  Pt report sitting is still the most difficult for her and has made approximately 15% improvement in that area since initial evaluation.  Patient with increased hip internal rotation and hip flexor strength noted today.  Patient continues to report improvements with dry needling and proceeded with treatment today.  Patient placed on waiting list for pelvic floor PT to try to get an appointment sooner than 10/17/2022.  Patient continues to require skilled PT to progress  towards goal related activities.    OBJECTIVE IMPAIRMENTS: decreased activity tolerance, decreased mobility, difficulty walking, decreased ROM, decreased strength, increased fascial restrictions, impaired perceived functional ability, and pain.   ACTIVITY LIMITATIONS: sitting, standing, sleeping, and locomotion level  PARTICIPATION LIMITATIONS: driving, community activity, and occupation  PERSONAL FACTORS: Past/current experiences and Time since onset of injury/illness/exacerbation are also affecting patient's functional outcome.   REHAB POTENTIAL: Good  CLINICAL DECISION MAKING: Stable/uncomplicated  EVALUATION COMPLEXITY: Low   GOALS: Goals reviewed with patient? Yes  SHORT TERM GOALS: Target date: 09/24/2022    Updated (09/18/22)   The patient will demonstrate knowledge of basic self care strategies and exercises to promote healing   Baseline: Goal status: MET  2.  The patient will report a 30% improvement in pain levels with functional activities which are currently difficult including sitting for work and traveling  (upcoming trip to [place]) Baseline:  Goal status: IN PROGRESS (reports 15% improvement on 09/25/22)  3.  The patient will have improved hip strength to at least 4+/5 needed for standing, walking inclines, walking longer distances  Baseline:  Goal status: MET on 09/25/2022  4. Improved right hip internal rotation ROM to 15 degrees needed for improved  Baseline:  Goal status: MET on 09/25/2022   LONG TERM GOALS: Target date: 10/22/2022    The patient will be independent in a safe self progression of a home exercise program to promote further recovery of function  Baseline:  Goal status: INITIAL  2.  The patient will report a 70% improvement in pain levels with functional activities which are currently difficult including sitting Baseline:  Goal status: INITIAL  3.  The patient will have improved trunk flexor and extensor muscle strength to at least 4+/5  needed for lifting medium weight objects such as grocery bags, laundry and luggage  Baseline:  Goal status: INITIAL  4.  Improved sitting tolerance for flight to [place] and gait stamina for walking moderate community distances for travel Baseline:  Goal status: INITIAL  5.  Modified Oswestry score improved to 30% indicating improved function with less pain Baseline:  Goal status: INITIAL    PLAN:  PT FREQUENCY: 2x/week  PT DURATION: 8 weeks  PLANNED INTERVENTIONS: Therapeutic exercises, Therapeutic activity, Neuromuscular re-education, Patient/Family education, Self Care, Joint mobilization, Aquatic Therapy, Dry Needling, Spinal mobilization, Cryotherapy, Moist heat, Taping, Traction, Ionotophoresis 4mg /ml Dexamethasone, Manual therapy, and Re-evaluation.  PLAN FOR NEXT SESSION: assess response to DN of QL and DN with ES to lumbar multifidi;  bil hip mobs; hip internal rotation ROM;  QL, bil  glute strengthening; core strengthening progress to standing; review and progress hip hinge  Continue with above and do pelvic floor soft tissue work and dry needling to coccyx with pelvic PT    Reather Laurence, PT, DPT 09/25/22 9:29 AM   [facility] [address] [place], [place] [address] Phone # [phone] Fax [phone]

## 2022-09-27 ENCOUNTER — Ambulatory Visit: Payer: 59 | Admitting: Physical Therapy

## 2022-09-27 ENCOUNTER — Encounter: Payer: Self-pay | Admitting: Physical Therapy

## 2022-09-27 DIAGNOSIS — R531 Weakness: Secondary | ICD-10-CM | POA: Diagnosis not present

## 2022-09-27 DIAGNOSIS — M5459 Other low back pain: Secondary | ICD-10-CM | POA: Diagnosis not present

## 2022-09-27 NOTE — Therapy (Signed)
OUTPATIENT PHYSICAL THERAPY TREATMENT NOTE   Patient Name: Kathryn Cohen MRN: [id number] DOB:Jul 12, 1975, 47 y.o., female Today's Date: 08/27/22  END OF SESSION:  PT End of Session - 09/27/22 1000     Visit Number 8    Date for PT Re-Evaluation 10/22/22    Authorization Type [facility] [employer]    PT Start Time 0932    PT Stop Time 1003    PT Time Calculation (min) 31 min    Activity Tolerance Patient tolerated treatment well    Behavior During Therapy WFL for tasks assessed/performed               Past Medical History:  Diagnosis Date   Allergy    Asthma    Back pain    Breast hypertrophy    Laceration of hand    rt long, ring and small fingers   Neck pain    Past Surgical History:  Procedure Laterality Date   BREAST REDUCTION SURGERY Bilateral 03/14/2017   Procedure: BILATERAL MAMMARY REDUCTION  (BREAST);  Surgeon: Glenna Fellows, MD;  Location: [facility];  Service: Plastics;  Laterality: Bilateral;   NERVE, TENDON AND ARTERY REPAIR Right 02/02/2015   Procedure: RIGHT LONG RING SMALL FINGER REPAIR NERVE, TENDON AND ARTERY REPAIR;  Surgeon: Betha Loa, MD;  Location: [facility];  Service: Orthopedics;  Laterality: Right;   REDUCTION MAMMAPLASTY Bilateral 02/2017   Patient Active Problem List   Diagnosis Date Noted   Asthma 02/24/2011   Allergic rhinitis 02/24/2011    PCP: Polite,Ronald MD  REFERRING PROVIDER: Polite,Ronald MD  REFERRING DIAG: low back pain  Rationale for Evaluation and Treatment: Rehabilitation  THERAPY DIAG:  Back pain; weakness  ONSET DATE: 04/29/2022  SUBJECTIVE:                                                                                                                                                                                           SUBJECTIVE STATEMENT: Patient was able to do a little more.  Less pain now.  Was able to sit a little more as well.  Can sit about 10 minutes but trying  not to do more as that is when pain starts to increase. Pt got a pillow with coccyx cutout  PERTINENT HISTORY:  Goes by Kathryn Cohen Ongoing problem related to a fall off horse at 47 years old; resolved but lifted something heavy triggered an exacerbation; then college age back spasms; got PT and acupuncture (bad experience lots of providers, lying lifted leg with weight worsened) Recent illness lying down flared up back Lots of recent travel may have aggravated Patient unable  to sit for the evaluation (standing more comfortable) 66 yo daughter PAIN:  PAIN:  Are you having pain? Yes NPRS scale: 1/10 currently.  2/10 with driving to clinic this morning Pain location: feels like a pebble at the tailbone; left low back and lateral hip  Aggravating factors: sitting > 1 hour sharp pains 7/10; walking inclines will hurt  Relieving factors: swims; stretching midback; yoga every morning; usually walking ok on level ground  PRECAUTIONS: None  WEIGHT BEARING RESTRICTIONS: No  FALLS:  Has patient fallen in last 6 months? No  LIVING ENVIRONMENT: Lives with: lives with their family Lives in: House/apartment   OCCUPATION: standing desk, tries to limit sitting  PLOF: Independent  PATIENT GOALS: reduce my pain; more functional I can barely sit at all; wants to travel;  mid June airplane [place];  need to be able to walk normal speed keep up with daughter 64 year old  NEXT MD VISIT: nothing scheduled  OBJECTIVE:   DIAGNOSTIC FINDINGS:  X-rays Mild degen lower thoracic  PATIENT SURVEYS:  Modified Oswestry: 40% moderate disability  COGNITION: Overall cognitive status: Within functional limits for tasks assessed       MUSCLE LENGTH: Hamstrings: Right 75 deg; Left 75 deg Thomas test: Right 10 deg; Left 10 deg  POSTURE: No Significant postural limitations  PALPATION: Decreased QL mobility  LUMBAR ROM:   AROM eval  Flexion Fingertips to shoes with feet together  Extension 20  Right  lateral flexion 20  Left lateral flexion 20  Right rotation   Left rotation    (Blank rows = not tested)  LOWER EXTREMITY ROM:   decreased right hip internal rotation ROM 5 degrees; left internal rotation 12 degrees  09/25/2022: Bilateral hip internal rotation is 25 degrees  TRUNK STRENGTH:  Decreased activation of transverse abdominus muscles; abdominals 4/5; decreased activation of lumbar multifidi; trunk extensors 4/5  Bird dog 4/5; modified plank 4/5; bent knee lift and lower= abs 4/5  LOWER EXTREMITY MMT:    MMT Right eval Left eval  Hip flexion 5 5  Hip extension 4 4  Hip abduction 4 4  Hip adduction    Hip internal rotation    Hip external rotation 4 4  Knee flexion    Knee extension 5 5  Ankle dorsiflexion    Ankle plantarflexion    Ankle inversion    Ankle eversion     (Blank rows = not tested)  09/25/2022: Right hip flexion strength of 5-/5 Left hip flexion strength of 4+/5  LUMBAR SPECIAL TESTS:  Straight leg raise test: Negative and Single leg stance test: Negative   GAIT: Comments: decreased gait speed  TODAY'S TREATMENT:            DATE: 09/26/22 Manual Trigger Point Dry-Needling  Treatment instructions: Expect mild to moderate muscle soreness. S/S of pneumothorax if dry needled over a lung field, and to seek immediate medical attention should they occur. Patient verbalized understanding of these instructions and education.  Patient Consent Given: Yes Education handout provided: Yes Muscles treated: bil coccygeus attatchments Electrical stimulation performed: No Parameters: N/A Treatment response/outcome: increased soft tissue length  Patient confirms identification and approves physical therapist to perform internal soft tissue work  - rectally performed puborectalis and coccygeus release bil, stretch to coccygeal ligament STM - stretch and compression mobs to sacrum, TPR to bil gluteals      09/25/2022 Remeasure A/ROM and hip  strength Modified plank on knees 3x 10 sec (with improved core contraction noted today) 3  way child's pose x20 sec each direction Quadruped rocking x10 Quadruped partial range donkey kicks 2x10 bilat Quadruped Academic librarian x5 bilat Quadruped cat/cow 2x10 Side-lying bent knee SI joint slide 2x10 bilat Side-lying clamshells 2x10 bilat Trigger Point Dry-Needling  Treatment instructions: Expect mild to moderate muscle soreness. S/S of pneumothorax if dry needled over a lung field, and to seek immediate medical attention should they occur. Patient verbalized understanding of these instructions and education. Patient Consent Given: Yes Education handout provided: Yes Muscles treated: bilat gluteals/piriformis and bil lumbar multifidi Electrical stimulation performed: No Parameters: N/A Treatment response/outcome: Utilized skilled palpation to locate bony landmarks and identify trigger points.  Able to illicit twitch response and muscle elongation.   DATE: 09/18/2022 Manual Trigger Point Dry-Needling  Treatment instructions: Expect mild to moderate muscle soreness. S/S of pneumothorax if dry needled over a lung field, and to seek immediate medical attention should they occur. Patient verbalized understanding of these instructions and education.  Patient Consent Given: Yes Education handout provided: Yes Muscles treated: bil gluteals, lumbar paraspinals Electrical stimulation performed: No Parameters: N/A Treatment response/outcome: increased soft tissue length  Patient confirms identification and approves physical therapist to perform internal soft tissue work  - rectally performed puborectalis and coccygeus release bil  Exercise: Cat cow Child pose with side bend Child pose hips elevated  DATE: 09/13/2022 6 minutes ambulation outside x1411 ft Modified plank on knees 3x 10 sec (with improved core contraction noted today) Childs pose x20 sec Quadruped rocking x10 Quadruped partial  range donkey kicks 2x10 bilat Quadruped Academic librarian x5 bilat Childs pose x20 sec Side-lying bent knee SI joint slide 2x10 bilat Side-lying clamshells 2x10 bilat Abdominal brace: with hand to opp knee isometric push 10x5 sec hold (cuing to maintain contraction for 5 sec instead of a fast count) Supine posterior pelvic tilt x10 Standing with lateral hip shift towards wall x10 bilat Standing hip flexor with lateral overhead reach for QL stretch 3x10 sec bilat                                                                                                                           PATIENT EDUCATION:  Education details: Educated patient on anatomy and physiology of current symptoms, prognosis, plan of care as well as initial self care strategies to promote recovery Use of lumbar roll when sitting to avoid sacral sitting Person educated: Patient Education method: Explanation Education comprehension: verbalized understanding  HOME EXERCISE PROGRAM: Access Code: [email or web address] URL: [email or web address] Date: 09/25/2022 Prepared by: Clydie Braun Menke  Exercises - Supine Piriformis Stretch (Mirrored)  - 1 x daily - 7 x weekly - 1 sets - 3 reps - 20 hold - Clamshell  - 1 x daily - 7 x weekly - 1 sets - 10 reps - Hooklying Isometric Hip Flexion  - 1 x daily - 7 x weekly - 1 sets - 5 reps - Hooklying Sequential Leg March and Lower  - 1 x daily - 7 x weekly -  1 sets - 10 reps - Plank on Knees  - 1 x daily - 7 x weekly - 1 sets - 10 reps - Quadruped Hip Extension Kicks  - 1 x daily - 7 x weekly - 2 sets - 10 reps - Quadruped Rocking Slow  - 1 x daily - 7 x weekly - 1 sets - 10 reps - Standing Hip Internal and External Rotation AROM  - 1 x daily - 7 x weekly - 1 sets - 10 reps - Half Kneeling Hip Flexor Stretch with Tri-Planar Reach  - 1 x daily - 7 x weekly - 3 sets - 10 reps - Quadruped Cat Cow  - 1 x daily - 7 x weekly - 3 sets - 10 reps - Quadruped Thoracic Spine Extension  - 1 x  daily - 7 x weekly - 3 sets - 10 reps - Quadruped Thoracic Lumbar Side Bend  - 1 x daily - 7 x weekly - 3 sets - 10 reps  ASSESSMENT:  CLINICAL IMPRESSION: Kathryn Cohen presents to skilled PT reporting improvements noted since previous pelvic floor PT session. Today's session focused on soft tissue release around the coccyx as noted. Pt had no immediate increased pain but was told to use some ice if some inflammation should occur.  Pt encouraged to continue stretches and exercises as she has with ortho PT.  Patient continues to require skilled PT to progress towards goal related activities.    OBJECTIVE IMPAIRMENTS: decreased activity tolerance, decreased mobility, difficulty walking, decreased ROM, decreased strength, increased fascial restrictions, impaired perceived functional ability, and pain.   ACTIVITY LIMITATIONS: sitting, standing, sleeping, and locomotion level  PARTICIPATION LIMITATIONS: driving, community activity, and occupation  PERSONAL FACTORS: Past/current experiences and Time since onset of injury/illness/exacerbation are also affecting patient's functional outcome.   REHAB POTENTIAL: Good  CLINICAL DECISION MAKING: Stable/uncomplicated  EVALUATION COMPLEXITY: Low   GOALS: Goals reviewed with patient? Yes  SHORT TERM GOALS: Target date: 09/24/2022    Updated (09/18/22)   The patient will demonstrate knowledge of basic self care strategies and exercises to promote healing   Baseline: Goal status: MET  2.  The patient will report a 30% improvement in pain levels with functional activities which are currently difficult including sitting for work and traveling  (upcoming trip to [place]) Baseline:  Goal status: IN PROGRESS (reports 15% improvement on 09/25/22)  3.  The patient will have improved hip strength to at least 4+/5 needed for standing, walking inclines, walking longer distances  Baseline:  Goal status: MET on 09/25/2022  4. Improved right hip internal  rotation ROM to 15 degrees needed for improved  Baseline:  Goal status: MET on 09/25/2022   LONG TERM GOALS: Target date: 10/22/2022    The patient will be independent in a safe self progression of a home exercise program to promote further recovery of function  Baseline:  Goal status: INITIAL  2.  The patient will report a 70% improvement in pain levels with functional activities which are currently difficult including sitting Baseline:  Goal status: INITIAL  3.  The patient will have improved trunk flexor and extensor muscle strength to at least 4+/5 needed for lifting medium weight objects such as grocery bags, laundry and luggage  Baseline:  Goal status: INITIAL  4.  Improved sitting tolerance for flight to [place] and gait stamina for walking moderate community distances for travel Baseline:  Goal status: INITIAL  5.  Modified Oswestry score improved to 30% indicating improved function with  less pain Baseline:  Goal status: INITIAL    PLAN:  PT FREQUENCY: 2x/week  PT DURATION: 8 weeks  PLANNED INTERVENTIONS: Therapeutic exercises, Therapeutic activity, Neuromuscular re-education, Patient/Family education, Self Care, Joint mobilization, Aquatic Therapy, Dry Needling, Spinal mobilization, Cryotherapy, Moist heat, Taping, Traction, Ionotophoresis 4mg /ml Dexamethasone, Manual therapy, and Re-evaluation.  PLAN FOR NEXT SESSION: assess response to DN of QL and DN with ES to lumbar multifidi;  bil hip mobs; hip internal rotation ROM;  QL, bil glute strengthening; core strengthening progress to standing; review and progress hip hinge  Continue with above and do pelvic floor soft tissue work and dry needling to coccyx with pelvic PT   Russella Dar, PT, DPT 09/27/22 10:01 AM    [facility] [address] [place], [place] [address] Phone # [phone] Fax [phone]

## 2022-10-01 ENCOUNTER — Ambulatory Visit: Payer: 59 | Attending: Internal Medicine | Admitting: Physical Therapy

## 2022-10-01 DIAGNOSIS — M5459 Other low back pain: Secondary | ICD-10-CM

## 2022-10-01 DIAGNOSIS — R531 Weakness: Secondary | ICD-10-CM | POA: Diagnosis not present

## 2022-10-01 DIAGNOSIS — F4322 Adjustment disorder with anxiety: Secondary | ICD-10-CM | POA: Diagnosis not present

## 2022-10-01 NOTE — Therapy (Signed)
OUTPATIENT PHYSICAL THERAPY TREATMENT NOTE   Patient Name: Kathryn Cohen MRN: [id number] DOB:08-09-1975, 47 y.o., female Today's Date: 08/27/22  END OF SESSION:  PT End of Session - 10/01/22 0928     Visit Number 9    Date for PT Re-Evaluation 10/22/22    Authorization Type [facility] [employer]    PT Start Time 0930    PT Stop Time 1014    PT Time Calculation (min) 44 min    Activity Tolerance Patient tolerated treatment well               Past Medical History:  Diagnosis Date   Allergy    Asthma    Back pain    Breast hypertrophy    Laceration of hand    rt long, ring and small fingers   Neck pain    Past Surgical History:  Procedure Laterality Date   BREAST REDUCTION SURGERY Bilateral 03/14/2017   Procedure: BILATERAL MAMMARY REDUCTION  (BREAST);  Surgeon: Glenna Fellows, MD;  Location: [facility];  Service: Plastics;  Laterality: Bilateral;   NERVE, TENDON AND ARTERY REPAIR Right 02/02/2015   Procedure: RIGHT LONG RING SMALL FINGER REPAIR NERVE, TENDON AND ARTERY REPAIR;  Surgeon: Betha Loa, MD;  Location: [facility];  Service: Orthopedics;  Laterality: Right;   REDUCTION MAMMAPLASTY Bilateral 02/2017   Patient Active Problem List   Diagnosis Date Noted   Asthma 02/24/2011   Allergic rhinitis 02/24/2011    PCP: Polite,Ronald MD  REFERRING PROVIDER: Polite,Ronald MD  REFERRING DIAG: low back pain  Rationale for Evaluation and Treatment: Rehabilitation  THERAPY DIAG:  Back pain; weakness  ONSET DATE: 04/29/2022  SUBJECTIVE:                                                                                                                                                                                           SUBJECTIVE STATEMENT: More sitting than I was able to before.  About 10 minutes.  Going to [place] on 6/21.  Got off the medicine! PERTINENT HISTORY:  Goes by Kathryn Cohen Ongoing problem related to a fall off horse at 47  years old; resolved but lifted something heavy triggered an exacerbation; then college age back spasms; got PT and acupuncture (bad experience lots of providers, lying lifted leg with weight worsened) Recent illness lying down flared up back Lots of recent travel may have aggravated Patient unable to sit for the evaluation (standing more comfortable) 80 yo daughter PAIN:  PAIN:  Are you having pain? Yes NPRS scale: 1/10 currently left ischial area when I stretch a certain way; left low back Pain  location: feels like a pebble at the tailbone; left low back and lateral hip  Aggravating factors: sitting > 1 hour sharp pains 7/10; walking inclines will hurt  Relieving factors: swims; stretching midback; yoga every morning; usually walking ok on level ground  PRECAUTIONS: None  WEIGHT BEARING RESTRICTIONS: No  FALLS:  Has patient fallen in last 6 months? No  LIVING ENVIRONMENT: Lives with: lives with their family Lives in: House/apartment   OCCUPATION: standing desk, tries to limit sitting  PLOF: Independent  PATIENT GOALS: reduce my pain; more functional I can barely sit at all; wants to travel;  mid June airplane [place];  need to be able to walk normal speed keep up with daughter 25 year old  NEXT MD VISIT: nothing scheduled  OBJECTIVE:   DIAGNOSTIC FINDINGS:  X-rays Mild degen lower thoracic  PATIENT SURVEYS:  Modified Oswestry: 40% moderate disability 6/4: 32%  COGNITION: Overall cognitive status: Within functional limits for tasks assessed       MUSCLE LENGTH: Hamstrings: Right 75 deg; Left 75 deg Thomas test: Right 10 deg; Left 10 deg  POSTURE: No Significant postural limitations  PALPATION: Decreased QL mobility  LUMBAR ROM:   AROM eval  Flexion Fingertips to shoes with feet together  Extension 20  Right lateral flexion 20  Left lateral flexion 20  Right rotation   Left rotation    (Blank rows = not tested)  LOWER EXTREMITY ROM:   decreased right hip  internal rotation ROM 5 degrees; left internal rotation 12 degrees  09/25/2022: Bilateral hip internal rotation is 25 degrees  TRUNK STRENGTH:  Decreased activation of transverse abdominus muscles; abdominals 4/5; decreased activation of lumbar multifidi; trunk extensors 4/5  Bird dog 4/5; modified plank 4/5; bent knee lift and lower= abs 4/5  LOWER EXTREMITY MMT:    MMT Right eval Left eval  Hip flexion 5 5  Hip extension 4 4  Hip abduction 4 4  Hip adduction    Hip internal rotation    Hip external rotation 4 4  Knee flexion    Knee extension 5 5  Ankle dorsiflexion    Ankle plantarflexion    Ankle inversion    Ankle eversion     (Blank rows = not tested)  09/25/2022: Right hip flexion strength of 5-/5 Left hip flexion strength of 4+/5  LUMBAR SPECIAL TESTS:  Straight leg raise test: Negative and Single leg stance test: Negative   GAIT: Comments: decreased gait speed  TODAY'S TREATMENT:        10/01/2022 Modified Oswestry Piriformis stretch with green ball (limited ROM on left) Supine red loop single leg clam 10x right/left Bridge with red loop around thighs 10x Prone over 1 pillow: pelvic press 5x, whole leg lift 5x right/left; donkey kick 5x right/left Manual therapy Trigger Point Dry-Needling  Treatment instructions: Expect mild to moderate muscle soreness. S/S of pneumothorax if dry needled over a lung field, and to seek immediate medical attention should they occur. Patient verbalized understanding of these instructions and education. Patient Consent Given: Yes Education handout provided: Yes Muscles treated: left gluteals/piriformis in sidelying and bil lumbar multifidi in prone Electrical stimulation performed: No Parameters: N/A Treatment response/outcome: Utilized skilled palpation to locate bony landmarks and identify trigger points.  Able to illicit twitch response and muscle elongation.       DATE: 09/26/22 Manual Trigger Point Dry-Needling   Treatment instructions: Expect mild to moderate muscle soreness. S/S of pneumothorax if dry needled over a lung field, and to seek immediate  medical attention should they occur. Patient verbalized understanding of these instructions and education.  Patient Consent Given: Yes Education handout provided: Yes Muscles treated: bil coccygeus attatchments Electrical stimulation performed: No Parameters: N/A Treatment response/outcome: increased soft tissue length  Patient confirms identification and approves physical therapist to perform internal soft tissue work  - rectally performed puborectalis and coccygeus release bil, stretch to coccygeal ligament STM - stretch and compression mobs to sacrum, TPR to bil gluteals      09/25/2022 Remeasure A/ROM and hip strength Modified plank on knees 3x 10 sec (with improved core contraction noted today) 3 way child's pose x20 sec each direction Quadruped rocking x10 Quadruped partial range donkey kicks 2x10 bilat Quadruped Academic librarian x5 bilat Quadruped cat/cow 2x10 Side-lying bent knee SI joint slide 2x10 bilat Side-lying clamshells 2x10 bilat Trigger Point Dry-Needling  Treatment instructions: Expect mild to moderate muscle soreness. S/S of pneumothorax if dry needled over a lung field, and to seek immediate medical attention should they occur. Patient verbalized understanding of these instructions and education. Patient Consent Given: Yes Education handout provided: Yes Muscles treated: bilat gluteals/piriformis and bil lumbar multifidi Electrical stimulation performed: No Parameters: N/A Treatment response/outcome: Utilized skilled palpation to locate bony landmarks and identify trigger points.  Able to illicit twitch response and muscle elongation.   DATE: 09/18/2022 Manual Trigger Point Dry-Needling  Treatment instructions: Expect mild to moderate muscle soreness. S/S of pneumothorax if dry needled over a lung field, and to seek  immediate medical attention should they occur. Patient verbalized understanding of these instructions and education.  Patient Consent Given: Yes Education handout provided: Yes Muscles treated: bil gluteals, lumbar paraspinals Electrical stimulation performed: No Parameters: N/A Treatment response/outcome: increased soft tissue length  Patient confirms identification and approves physical therapist to perform internal soft tissue work  - rectally performed puborectalis and coccygeus release bil  Exercise: Cat cow Child pose with side bend Child pose hips elevated                                                                                                                        PATIENT EDUCATION:  Education details: Educated patient on anatomy and physiology of current symptoms, prognosis, plan of care as well as initial self care strategies to promote recovery Use of lumbar roll when sitting to avoid sacral sitting Person educated: Patient Education method: Explanation Education comprehension: verbalized understanding  HOME EXERCISE PROGRAM: Access Code: [email or web address] URL: [email or web address] Date: 09/25/2022 Prepared by: Clydie Braun Menke  Exercises - Supine Piriformis Stretch (Mirrored)  - 1 x daily - 7 x weekly - 1 sets - 3 reps - 20 hold - Clamshell  - 1 x daily - 7 x weekly - 1 sets - 10 reps - Hooklying Isometric Hip Flexion  - 1 x daily - 7 x weekly - 1 sets - 5 reps - Hooklying Sequential Leg March and Lower  - 1 x daily - 7 x weekly - 1 sets - 10 reps - Plank on  Knees  - 1 x daily - 7 x weekly - 1 sets - 10 reps - Quadruped Hip Extension Kicks  - 1 x daily - 7 x weekly - 2 sets - 10 reps - Quadruped Rocking Slow  - 1 x daily - 7 x weekly - 1 sets - 10 reps - Standing Hip Internal and External Rotation AROM  - 1 x daily - 7 x weekly - 1 sets - 10 reps - Half Kneeling Hip Flexor Stretch with Tri-Planar Reach  - 1 x daily - 7 x weekly - 3 sets - 10 reps -  Quadruped Cat Cow  - 1 x daily - 7 x weekly - 3 sets - 10 reps - Quadruped Thoracic Spine Extension  - 1 x daily - 7 x weekly - 3 sets - 10 reps - Quadruped Thoracic Lumbar Side Bend  - 1 x daily - 7 x weekly - 3 sets - 10 reps  ASSESSMENT:  CLINICAL IMPRESSION: Good improvement in Modified Oswestry score from 40% on initial assessment to 32% today.  She reports she has been able to go off medication and sitting tolerance has improved slightly.  Some difficulty isolating lumbar multifidi in prone without over-reliance of gluteals.  Limited left hip external rotation ROM.  Therapist monitoring response to all interventions and modifying treatment accordingly.     OBJECTIVE IMPAIRMENTS: decreased activity tolerance, decreased mobility, difficulty walking, decreased ROM, decreased strength, increased fascial restrictions, impaired perceived functional ability, and pain.   ACTIVITY LIMITATIONS: sitting, standing, sleeping, and locomotion level  PARTICIPATION LIMITATIONS: driving, community activity, and occupation  PERSONAL FACTORS: Past/current experiences and Time since onset of injury/illness/exacerbation are also affecting patient's functional outcome.   REHAB POTENTIAL: Good  CLINICAL DECISION MAKING: Stable/uncomplicated  EVALUATION COMPLEXITY: Low   GOALS: Goals reviewed with patient? Yes  SHORT TERM GOALS: Target date: 09/24/2022    Updated (09/18/22)   The patient will demonstrate knowledge of basic self care strategies and exercises to promote healing   Baseline: Goal status: MET  2.  The patient will report a 30% improvement in pain levels with functional activities which are currently difficult including sitting for work and traveling  (upcoming trip to [place]) Baseline:  Goal status: IN PROGRESS (reports 15% improvement on 09/25/22)  3.  The patient will have improved hip strength to at least 4+/5 needed for standing, walking inclines, walking longer distances  Baseline:   Goal status: MET on 09/25/2022  4. Improved right hip internal rotation ROM to 15 degrees needed for improved  Baseline:  Goal status: MET on 09/25/2022   LONG TERM GOALS: Target date: 10/22/2022    The patient will be independent in a safe self progression of a home exercise program to promote further recovery of function  Baseline:  Goal status: INITIAL  2.  The patient will report a 70% improvement in pain levels with functional activities which are currently difficult including sitting Baseline:  Goal status: INITIAL  3.  The patient will have improved trunk flexor and extensor muscle strength to at least 4+/5 needed for lifting medium weight objects such as grocery bags, laundry and luggage  Baseline:  Goal status: INITIAL  4.  Improved sitting tolerance for flight to [place] and gait stamina for walking moderate community distances for travel Baseline:  Goal status: INITIAL  5.  Modified Oswestry score improved to 30% indicating improved function with less pain Baseline:  Goal status: INITIAL    PLAN:  PT FREQUENCY: 2x/week  PT DURATION:  8 weeks  PLANNED INTERVENTIONS: Therapeutic exercises, Therapeutic activity, Neuromuscular re-education, Patient/Family education, Self Care, Joint mobilization, Aquatic Therapy, Dry Needling, Spinal mobilization, Cryotherapy, Moist heat, Taping, Traction, Ionotophoresis 4mg /ml Dexamethasone, Manual therapy, and Re-evaluation.  PLAN FOR NEXT SESSION: assess response to DN   bil hip mobs; hip internal rotation ROM;  QL, bil glute strengthening; core strengthening progress to standing;  Continue with above and do pelvic floor soft tissue work and dry needling to coccyx with pelvic PT    Lavinia Sharps, PT 10/01/22 6:59 PM Phone: [phone] Fax: [phone]   [facility] [address] [place], [place] [address] Phone # [phone] Fax [phone]

## 2022-10-08 ENCOUNTER — Ambulatory Visit: Payer: 59 | Admitting: Physical Therapy

## 2022-10-08 DIAGNOSIS — M5459 Other low back pain: Secondary | ICD-10-CM

## 2022-10-08 DIAGNOSIS — R531 Weakness: Secondary | ICD-10-CM

## 2022-10-08 NOTE — Therapy (Signed)
OUTPATIENT PHYSICAL THERAPY TREATMENT NOTE   Patient Name: Kathryn Cohen MRN: [id number] DOB:May 23, 1975, 47 y.o., female Today's Date: 08/27/22  END OF SESSION:  PT End of Session - 10/08/22 1016     Visit Number 10    Date for PT Re-Evaluation 10/22/22    Authorization Type [facility] [employer]    PT Start Time 1016    PT Stop Time 1055    PT Time Calculation (min) 39 min    Activity Tolerance Patient tolerated treatment well               Past Medical History:  Diagnosis Date   Allergy    Asthma    Back pain    Breast hypertrophy    Laceration of hand    rt long, ring and small fingers   Neck pain    Past Surgical History:  Procedure Laterality Date   BREAST REDUCTION SURGERY Bilateral 03/14/2017   Procedure: BILATERAL MAMMARY REDUCTION  (BREAST);  Surgeon: Glenna Fellows, MD;  Location: [facility];  Service: Plastics;  Laterality: Bilateral;   NERVE, TENDON AND ARTERY REPAIR Right 02/02/2015   Procedure: RIGHT LONG RING SMALL FINGER REPAIR NERVE, TENDON AND ARTERY REPAIR;  Surgeon: Betha Loa, MD;  Location: [facility];  Service: Orthopedics;  Laterality: Right;   REDUCTION MAMMAPLASTY Bilateral 02/2017   Patient Active Problem List   Diagnosis Date Noted   Asthma 02/24/2011   Allergic rhinitis 02/24/2011    PCP: Polite,Ronald MD  REFERRING PROVIDER: Polite,Ronald MD  REFERRING DIAG: low back pain  Rationale for Evaluation and Treatment: Rehabilitation  THERAPY DIAG:  Back pain; weakness  ONSET DATE: 04/29/2022  SUBJECTIVE:                                                                                                                                                                                           SUBJECTIVE STATEMENT: Going a little better.  Can do some short sitting bouts at work.  A little irritated on Sunday b/c moving stuff around the house.  I think the DN always helps.    PERTINENT HISTORY:  Goes by  Kathryn Cohen Ongoing problem related to a fall off horse at 47 years old; resolved but lifted something heavy triggered an exacerbation; then college age back spasms; got PT and acupuncture (bad experience lots of providers, lying lifted leg with weight worsened) Recent illness lying down flared up back Lots of recent travel may have aggravated Patient unable to sit for the evaluation (standing more comfortable) 49 yo daughter PAIN:  PAIN:  Are you having pain? Yes NPRS scale: not a consistent  1/10 currently left ischial area when I stretch a certain way; left low back Pain location: feels like a pebble at the tailbone; left low back and lateral hip  Aggravating factors: sitting > 1 hour sharp pains 7/10; walking inclines will hurt  Relieving factors: swims; stretching midback; yoga every morning; usually walking ok on level ground  PRECAUTIONS: None  WEIGHT BEARING RESTRICTIONS: No  FALLS:  Has patient fallen in last 6 months? No  LIVING ENVIRONMENT: Lives with: lives with their family Lives in: House/apartment   OCCUPATION: standing desk, tries to limit sitting  PLOF: Independent  PATIENT GOALS: reduce my pain; more functional I can barely sit at all; wants to travel;  mid June airplane [place];  need to be able to walk normal speed keep up with daughter 48 year old  NEXT MD VISIT: nothing scheduled  OBJECTIVE:   DIAGNOSTIC FINDINGS:  X-rays Mild degen lower thoracic  PATIENT SURVEYS:  Modified Oswestry: 40% moderate disability 6/4: 32%  COGNITION: Overall cognitive status: Within functional limits for tasks assessed       MUSCLE LENGTH: Hamstrings: Right 75 deg; Left 75 deg Thomas test: Right 10 deg; Left 10 deg  POSTURE: No Significant postural limitations  PALPATION: Decreased QL mobility  LUMBAR ROM:   AROM eval  Flexion Fingertips to shoes with feet together  Extension 20  Right lateral flexion 20  Left lateral flexion 20  Right rotation   Left rotation     (Blank rows = not tested)  LOWER EXTREMITY ROM:   decreased right hip internal rotation ROM 5 degrees; left internal rotation 12 degrees  09/25/2022: Bilateral hip internal rotation is 25 degrees  TRUNK STRENGTH:  Decreased activation of transverse abdominus muscles; abdominals 4/5; decreased activation of lumbar multifidi; trunk extensors 4/5  Bird dog 4/5; modified plank 4/5; bent knee lift and lower= abs 4/5  LOWER EXTREMITY MMT:    MMT Right eval Left eval 6/11  Hip flexion 5 5 5   Hip extension 4 4 4+  Hip abduction 4 4 4+  Hip adduction     Hip internal rotation     Hip external rotation 4 4 4+  Knee flexion     Knee extension 5 5 5   Ankle dorsiflexion     Ankle plantarflexion     Ankle inversion     Ankle eversion      (Blank rows = not tested)  09/25/2022: Right hip flexion strength of 5-/5 Left hip flexion strength of 4+/5  LUMBAR SPECIAL TESTS:  Straight leg raise test: Negative and Single leg stance test: Negative   GAIT: Comments: decreased gait speed  TODAY'S TREATMENT:        10/08/2022 Prone over 1 pillow: pelvic press 5x, whole leg lift 5x right/left; donkey kick 5x right/left Pallof series with red band:  press out, press up, press out and up; marching, back lunge 5x each right/left  Hip hinge golf club 8x cues for bending knees a bit Hip hinge 5# 8x Squats in front of table holding 5# goblet style 8x Manual therapy Trigger Point Dry-Needling  Treatment instructions: Expect mild to moderate muscle soreness. S/S of pneumothorax if dry needled over a lung field, and to seek immediate medical attention should they occur. Patient verbalized understanding of these instructions and education. Patient Consent Given: Yes Education handout provided: Yes Muscles treated: left gluteals/piriformis in sidelying and bil lumbar multifidi in prone Electrical stimulation performed: No Parameters: N/A Treatment response/outcome: Utilized skilled palpation to locate  bony landmarks  and identify trigger points.  Able to illicit twitch response and muscle elongation.  10/01/2022 Modified Oswestry Piriformis stretch with green ball (limited ROM on left) Supine red loop single leg clam 10x right/left Bridge with red loop around thighs 10x Prone over 1 pillow: pelvic press 5x, whole leg lift 5x right/left; donkey kick 5x right/left Manual therapy Trigger Point Dry-Needling  Treatment instructions: Expect mild to moderate muscle soreness. S/S of pneumothorax if dry needled over a lung field, and to seek immediate medical attention should they occur. Patient verbalized understanding of these instructions and education. Patient Consent Given: Yes Education handout provided: Yes Muscles treated: left gluteals/piriformis in sidelying and bil lumbar multifidi in prone Electrical stimulation performed: No Parameters: N/A Treatment response/outcome: Utilized skilled palpation to locate bony landmarks and identify trigger points.  Able to illicit twitch response and muscle elongation.       DATE: 09/26/22 Manual Trigger Point Dry-Needling  Treatment instructions: Expect mild to moderate muscle soreness. S/S of pneumothorax if dry needled over a lung field, and to seek immediate medical attention should they occur. Patient verbalized understanding of these instructions and education.  Patient Consent Given: Yes Education handout provided: Yes Muscles treated: bil coccygeus attatchments Electrical stimulation performed: No Parameters: N/A Treatment response/outcome: increased soft tissue length  Patient confirms identification and approves physical therapist to perform internal soft tissue work  - rectally performed puborectalis and coccygeus release bil, stretch to coccygeal ligament STM - stretch and compression mobs to sacrum, TPR to bil gluteals      09/25/2022 Remeasure A/ROM and hip strength Modified plank on knees 3x 10 sec (with improved core  contraction noted today) 3 way child's pose x20 sec each direction Quadruped rocking x10 Quadruped partial range donkey kicks 2x10 bilat Quadruped Academic librarian x5 bilat Quadruped cat/cow 2x10 Side-lying bent knee SI joint slide 2x10 bilat Side-lying clamshells 2x10 bilat Trigger Point Dry-Needling  Treatment instructions: Expect mild to moderate muscle soreness. S/S of pneumothorax if dry needled over a lung field, and to seek immediate medical attention should they occur. Patient verbalized understanding of these instructions and education. Patient Consent Given: Yes Education handout provided: Yes Muscles treated: bilat gluteals/piriformis and bil lumbar multifidi Electrical stimulation performed: No Parameters: N/A Treatment response/outcome: Utilized skilled palpation to locate bony landmarks and identify trigger points.  Able to illicit twitch response and muscle elongation.                                                                                                                       PATIENT EDUCATION:  Education details: Educated patient on anatomy and physiology of current symptoms, prognosis, plan of care as well as initial self care strategies to promote recovery Use of lumbar roll when sitting to avoid sacral sitting Person educated: Patient Education method: Explanation Education comprehension: verbalized understanding  HOME EXERCISE PROGRAM: Access Code: [email or web address] URL: [email or web address] Date: 09/25/2022 Prepared by: Clydie Braun Menke  Exercises - Supine Piriformis Stretch (Mirrored)  - 1 x daily - 7  x weekly - 1 sets - 3 reps - 20 hold - Clamshell  - 1 x daily - 7 x weekly - 1 sets - 10 reps - Hooklying Isometric Hip Flexion  - 1 x daily - 7 x weekly - 1 sets - 5 reps - Hooklying Sequential Leg March and Lower  - 1 x daily - 7 x weekly - 1 sets - 10 reps - Plank on Knees  - 1 x daily - 7 x weekly - 1 sets - 10 reps - Quadruped Hip Extension Kicks   - 1 x daily - 7 x weekly - 2 sets - 10 reps - Quadruped Rocking Slow  - 1 x daily - 7 x weekly - 1 sets - 10 reps - Standing Hip Internal and External Rotation AROM  - 1 x daily - 7 x weekly - 1 sets - 10 reps - Half Kneeling Hip Flexor Stretch with Tri-Planar Reach  - 1 x daily - 7 x weekly - 3 sets - 10 reps - Quadruped Cat Cow  - 1 x daily - 7 x weekly - 3 sets - 10 reps - Quadruped Thoracic Spine Extension  - 1 x daily - 7 x weekly - 3 sets - 10 reps - Quadruped Thoracic Lumbar Side Bend  - 1 x daily - 7 x weekly - 3 sets - 10 reps  ASSESSMENT:  CLINICAL IMPRESSION: Progression of lumbo/pelvic/hip strengthening to standing.  She is challenged by Pallof series in staggered stance/kickstand position.  Able to perform modified dead lift/hip hinge to knee level with a single 5# with ease.  Verbal cues to optimize technique.  Patient is fearful of attempting 2 5# weights.  Following ex she rates her pain level at a point 5.   Fewer tender points noted with soft tissue mobilization and following DN.   OBJECTIVE IMPAIRMENTS: decreased activity tolerance, decreased mobility, difficulty walking, decreased ROM, decreased strength, increased fascial restrictions, impaired perceived functional ability, and pain.   ACTIVITY LIMITATIONS: sitting, standing, sleeping, and locomotion level  PARTICIPATION LIMITATIONS: driving, community activity, and occupation  PERSONAL FACTORS: Past/current experiences and Time since onset of injury/illness/exacerbation are also affecting patient's functional outcome.   REHAB POTENTIAL: Good  CLINICAL DECISION MAKING: Stable/uncomplicated  EVALUATION COMPLEXITY: Low   GOALS: Goals reviewed with patient? Yes  SHORT TERM GOALS: Target date: 09/24/2022    Updated (09/18/22)   The patient will demonstrate knowledge of basic self care strategies and exercises to promote healing   Baseline: Goal status: MET  2.  The patient will report a 30% improvement in pain  levels with functional activities which are currently difficult including sitting for work and traveling  (upcoming trip to [place]) Baseline:  Goal status: IN PROGRESS (reports 15% improvement on 09/25/22)  3.  The patient will have improved hip strength to at least 4+/5 needed for standing, walking inclines, walking longer distances  Baseline:  Goal status: MET on 09/25/2022  4. Improved right hip internal rotation ROM to 15 degrees needed for improved  Baseline:  Goal status: MET on 09/25/2022   LONG TERM GOALS: Target date: 10/22/2022    The patient will be independent in a safe self progression of a home exercise program to promote further recovery of function  Baseline:  Goal status: INITIAL  2.  The patient will report a 70% improvement in pain levels with functional activities which are currently difficult including sitting Baseline:  Goal status: INITIAL  3.  The patient will have improved trunk  flexor and extensor muscle strength to at least 4+/5 needed for lifting medium weight objects such as grocery bags, laundry and luggage  Baseline:  Goal status: INITIAL  4.  Improved sitting tolerance for flight to [place] and gait stamina for walking moderate community distances for travel Baseline:  Goal status: INITIAL  5.  Modified Oswestry score improved to 30% indicating improved function with less pain Baseline:  Goal status: INITIAL    PLAN:  PT FREQUENCY: 2x/week  PT DURATION: 8 weeks  PLANNED INTERVENTIONS: Therapeutic exercises, Therapeutic activity, Neuromuscular re-education, Patient/Family education, Self Care, Joint mobilization, Aquatic Therapy, Dry Needling, Spinal mobilization, Cryotherapy, Moist heat, Taping, Traction, Ionotophoresis 4mg /ml Dexamethasone, Manual therapy, and Re-evaluation.  PLAN FOR NEXT SESSION: increase weight to 8#with squats and dead lifts;  add Pallof series to HEP red band;  try stir the pot and ABCs;  assess response to DN    Continue with  above and do pelvic floor soft tissue work and dry needling to coccyx with pelvic PT    Lavinia Sharps, PT 10/08/22 11:13 AM Phone: [phone] Fax: [phone]   [facility] [address] [place], [place] [address] Phone # [phone] Fax [phone]

## 2022-10-10 ENCOUNTER — Ambulatory Visit: Payer: 59 | Admitting: Physical Therapy

## 2022-10-10 ENCOUNTER — Encounter: Payer: Self-pay | Admitting: Physical Therapy

## 2022-10-10 DIAGNOSIS — M5459 Other low back pain: Secondary | ICD-10-CM

## 2022-10-10 DIAGNOSIS — R531 Weakness: Secondary | ICD-10-CM | POA: Diagnosis not present

## 2022-10-10 NOTE — Therapy (Signed)
OUTPATIENT PHYSICAL THERAPY TREATMENT NOTE   Patient Name: Kathryn Cohen MRN: [id number] DOB:1976/04/10, 47 y.o., female Today's Date: 08/27/22  END OF SESSION:  PT End of Session - 10/10/22 1230     Visit Number 11    Date for PT Re-Evaluation 10/22/22    Authorization Type [facility] [employer]    PT Start Time 1230    PT Stop Time 1311    PT Time Calculation (min) 41 min    Activity Tolerance Patient tolerated treatment well    Behavior During Therapy WFL for tasks assessed/performed                Past Medical History:  Diagnosis Date   Allergy    Asthma    Back pain    Breast hypertrophy    Laceration of hand    rt long, ring and small fingers   Neck pain    Past Surgical History:  Procedure Laterality Date   BREAST REDUCTION SURGERY Bilateral 03/14/2017   Procedure: BILATERAL MAMMARY REDUCTION  (BREAST);  Surgeon: Glenna Fellows, MD;  Location: [facility];  Service: Plastics;  Laterality: Bilateral;   NERVE, TENDON AND ARTERY REPAIR Right 02/02/2015   Procedure: RIGHT LONG RING SMALL FINGER REPAIR NERVE, TENDON AND ARTERY REPAIR;  Surgeon: Betha Loa, MD;  Location: [facility];  Service: Orthopedics;  Laterality: Right;   REDUCTION MAMMAPLASTY Bilateral 02/2017   Patient Active Problem List   Diagnosis Date Noted   Asthma 02/24/2011   Allergic rhinitis 02/24/2011    PCP: Polite,Ronald MD  REFERRING PROVIDER: Polite,Ronald MD  REFERRING DIAG: low back pain  Rationale for Evaluation and Treatment: Rehabilitation  THERAPY DIAG:  Back pain; weakness  ONSET DATE: 04/29/2022  SUBJECTIVE:                                                                                                                                                                                           SUBJECTIVE STATEMENT: Sitting was pretty good with cushion and that was about 30 minute.  Bending down has gotten a lot better.  Still hurts when  bending down.  PERTINENT HISTORY:  Goes by [name] Ongoing problem related to a fall off horse at 47 years old; resolved but lifted something heavy triggered an exacerbation; then college age back spasms; got PT and acupuncture (bad experience lots of providers, lying lifted leg with weight worsened) Recent illness lying down flared up back Lots of recent travel may have aggravated Patient unable to sit for the evaluation (standing more comfortable) 49 yo daughter PAIN:  PAIN:  Are you having pain? Yes NPRS scale:  not a consistent 1/10 currently left ischial area when I stretch a certain way; left low back Pain location: feels like a pebble at the tailbone; left low back and lateral hip  Aggravating factors: sitting > 1 hour sharp pains 7/10; walking inclines will hurt  Relieving factors: swims; stretching midback; yoga every morning; usually walking ok on level ground  PRECAUTIONS: None  WEIGHT BEARING RESTRICTIONS: No  FALLS:  Has patient fallen in last 6 months? No  LIVING ENVIRONMENT: Lives with: lives with their family Lives in: House/apartment   OCCUPATION: standing desk, tries to limit sitting  PLOF: Independent  PATIENT GOALS: reduce my pain; more functional I can barely sit at all; wants to travel;  mid June airplane [place];  need to be able to walk normal speed keep up with daughter 49 year old  NEXT MD VISIT: nothing scheduled  OBJECTIVE:   DIAGNOSTIC FINDINGS:  X-rays Mild degen lower thoracic  PATIENT SURVEYS:  Modified Oswestry: 40% moderate disability 6/4: 32%  COGNITION: Overall cognitive status: Within functional limits for tasks assessed       MUSCLE LENGTH: Hamstrings: Right 75 deg; Left 75 deg Thomas test: Right 10 deg; Left 10 deg  POSTURE: No Significant postural limitations  PALPATION: Decreased QL mobility  LUMBAR ROM:   AROM eval  Flexion Fingertips to shoes with feet together  Extension 20  Right lateral flexion 20  Left lateral  flexion 20  Right rotation   Left rotation    (Blank rows = not tested)  LOWER EXTREMITY ROM:   decreased right hip internal rotation ROM 5 degrees; left internal rotation 12 degrees  09/25/2022: Bilateral hip internal rotation is 25 degrees  TRUNK STRENGTH:  Decreased activation of transverse abdominus muscles; abdominals 4/5; decreased activation of lumbar multifidi; trunk extensors 4/5  Bird dog 4/5; modified plank 4/5; bent knee lift and lower= abs 4/5  LOWER EXTREMITY MMT:    MMT Right eval Left eval 6/11  Hip flexion 5 5 5   Hip extension 4 4 4+  Hip abduction 4 4 4+  Hip adduction     Hip internal rotation     Hip external rotation 4 4 4+  Knee flexion     Knee extension 5 5 5   Ankle dorsiflexion     Ankle plantarflexion     Ankle inversion     Ankle eversion      (Blank rows = not tested)  09/25/2022: Right hip flexion strength of 5-/5 Left hip flexion strength of 4+/5  LUMBAR SPECIAL TESTS:  Straight leg raise test: Negative and Single leg stance test: Negative   GAIT: Comments: decreased gait speed  TODAY'S TREATMENT:       10/10/2022 Assessed for pelvic obliquity - no asymmetry noted Reverse clam and hip abduction with internal rotation Manual therapy  Patient confirms identification and approves physical therapist to perform internal soft tissue work  - rectal to coccygeus bil and sacral mobs with coccyx stabilization    10/08/2022 Prone over 1 pillow: pelvic press 5x, whole leg lift 5x right/left; donkey kick 5x right/left Pallof series with red band:  press out, press up, press out and up; marching, back lunge 5x each right/left  Hip hinge golf club 8x cues for bending knees a bit Hip hinge 5# 8x Squats in front of table holding 5# goblet style 8x Manual therapy Trigger Point Dry-Needling  Treatment instructions: Expect mild to moderate muscle soreness. S/S of pneumothorax if dry needled over a lung field, and to seek  immediate medical attention  should they occur. Patient verbalized understanding of these instructions and education. Patient Consent Given: Yes Education handout provided: Yes Muscles treated: left gluteals/piriformis in sidelying and bil lumbar multifidi in prone Electrical stimulation performed: No Parameters: N/A Treatment response/outcome: Utilized skilled palpation to locate bony landmarks and identify trigger points.  Able to illicit twitch response and muscle elongation.  10/01/2022 Modified Oswestry Piriformis stretch with green ball (limited ROM on left) Supine red loop single leg clam 10x right/left Bridge with red loop around thighs 10x Prone over 1 pillow: pelvic press 5x, whole leg lift 5x right/left; donkey kick 5x right/left Manual therapy Trigger Point Dry-Needling  Treatment instructions: Expect mild to moderate muscle soreness. S/S of pneumothorax if dry needled over a lung field, and to seek immediate medical attention should they occur. Patient verbalized understanding of these instructions and education. Patient Consent Given: Yes Education handout provided: Yes Muscles treated: left gluteals/piriformis in sidelying and bil lumbar multifidi in prone Electrical stimulation performed: No Parameters: N/A Treatment response/outcome: Utilized skilled palpation to locate bony landmarks and identify trigger points.  Able to illicit twitch response and muscle elongation.       DATE: 09/26/22 Manual Trigger Point Dry-Needling  Treatment instructions: Expect mild to moderate muscle soreness. S/S of pneumothorax if dry needled over a lung field, and to seek immediate medical attention should they occur. Patient verbalized understanding of these instructions and education.  Patient Consent Given: Yes Education handout provided: Yes Muscles treated: bil coccygeus attatchments Electrical stimulation performed: No Parameters: N/A Treatment response/outcome: increased soft tissue length  Patient confirms  identification and approves physical therapist to perform internal soft tissue work  - rectally performed puborectalis and coccygeus release bil, stretch to coccygeal ligament STM - stretch and compression mobs to sacrum, TPR to bil gluteals                                                                                                                       PATIENT EDUCATION:  Education details: Educated patient on anatomy and physiology of current symptoms, prognosis, plan of care as well as initial self care strategies to promote recovery Use of lumbar roll when sitting to avoid sacral sitting Person educated: Patient Education method: Explanation Education comprehension: verbalized understanding  HOME EXERCISE PROGRAM: Access Code: [email or web address] URL: [email or web address] Date: 09/25/2022 Prepared by: Clydie Braun Menke  Exercises - Supine Piriformis Stretch (Mirrored)  - 1 x daily - 7 x weekly - 1 sets - 3 reps - 20 hold - Clamshell  - 1 x daily - 7 x weekly - 1 sets - 10 reps - Hooklying Isometric Hip Flexion  - 1 x daily - 7 x weekly - 1 sets - 5 reps - Hooklying Sequential Leg March and Lower  - 1 x daily - 7 x weekly - 1 sets - 10 reps - Plank on Knees  - 1 x daily - 7 x weekly - 1 sets - 10 reps - Quadruped Hip Extension Kicks  -  1 x daily - 7 x weekly - 2 sets - 10 reps - Quadruped Rocking Slow  - 1 x daily - 7 x weekly - 1 sets - 10 reps - Standing Hip Internal and External Rotation AROM  - 1 x daily - 7 x weekly - 1 sets - 10 reps - Half Kneeling Hip Flexor Stretch with Tri-Planar Reach  - 1 x daily - 7 x weekly - 3 sets - 10 reps - Quadruped Cat Cow  - 1 x daily - 7 x weekly - 3 sets - 10 reps - Quadruped Thoracic Spine Extension  - 1 x daily - 7 x weekly - 3 sets - 10 reps - Quadruped Thoracic Lumbar Side Bend  - 1 x daily - 7 x weekly - 3 sets - 10 reps  ASSESSMENT:  CLINICAL IMPRESSION: Soft tissue release to coccygeus.  More tender on left side.  Dry needling  not performed today as still sore from two days ago.  Pt still has weakness in the gluteals and given exercises for improved hip strength and pelvic stability.  Continue core and hip strength and soft tissue length for improved pain management.   OBJECTIVE IMPAIRMENTS: decreased activity tolerance, decreased mobility, difficulty walking, decreased ROM, decreased strength, increased fascial restrictions, impaired perceived functional ability, and pain.   ACTIVITY LIMITATIONS: sitting, standing, sleeping, and locomotion level  PARTICIPATION LIMITATIONS: driving, community activity, and occupation  PERSONAL FACTORS: Past/current experiences and Time since onset of injury/illness/exacerbation are also affecting patient's functional outcome.   REHAB POTENTIAL: Good  CLINICAL DECISION MAKING: Stable/uncomplicated  EVALUATION COMPLEXITY: Low   GOALS: Goals reviewed with patient? Yes  SHORT TERM GOALS: Target date: 09/24/2022    Updated (09/18/22)   The patient will demonstrate knowledge of basic self care strategies and exercises to promote healing   Baseline: Goal status: MET  2.  The patient will report a 30% improvement in pain levels with functional activities which are currently difficult including sitting for work and traveling  (upcoming trip to [place]) Baseline:  Goal status: IN PROGRESS (reports 15% improvement on 09/25/22)  3.  The patient will have improved hip strength to at least 4+/5 needed for standing, walking inclines, walking longer distances  Baseline:  Goal status: MET on 09/25/2022  4. Improved right hip internal rotation ROM to 15 degrees needed for improved  Baseline:  Goal status: MET on 09/25/2022   LONG TERM GOALS: Target date: 10/22/2022    The patient will be independent in a safe self progression of a home exercise program to promote further recovery of function  Baseline:  Goal status: INITIAL  2.  The patient will report a 70% improvement in pain levels  with functional activities which are currently difficult including sitting Baseline:  Goal status: INITIAL  3.  The patient will have improved trunk flexor and extensor muscle strength to at least 4+/5 needed for lifting medium weight objects such as grocery bags, laundry and luggage  Baseline:  Goal status: INITIAL  4.  Improved sitting tolerance for flight to [place] and gait stamina for walking moderate community distances for travel Baseline:  Goal status: INITIAL  5.  Modified Oswestry score improved to 30% indicating improved function with less pain Baseline:  Goal status: INITIAL    PLAN:  PT FREQUENCY: 2x/week  PT DURATION: 8 weeks  PLANNED INTERVENTIONS: Therapeutic exercises, Therapeutic activity, Neuromuscular re-education, Patient/Family education, Self Care, Joint mobilization, Aquatic Therapy, Dry Needling, Spinal mobilization, Cryotherapy, Moist heat, Taping, Traction, Ionotophoresis 4mg /ml  Dexamethasone, Manual therapy, and Re-evaluation.  PLAN FOR NEXT SESSION: increase weight to 8#with squats and dead lifts;  add Pallof series to HEP red band;  try stir the pot and ABCs;  assess response to DN    Continue with above and do pelvic floor soft tissue work and dry needling to coccyx with pelvic PT; single leg hip stability    Russella Dar, PT, DPT 10/10/22 12:31 PM    [facility] [address], [address] [place], [place] [address] Phone # [phone] Fax [phone]

## 2022-10-11 ENCOUNTER — Ambulatory Visit
Admission: RE | Admit: 2022-10-11 | Discharge: 2022-10-11 | Disposition: A | Discharge status: Home / Self Care | Payer: 59 | Source: Ambulatory Visit | Attending: Obstetrics and Gynecology | Admitting: Obstetrics and Gynecology

## 2022-10-11 DIAGNOSIS — Z1231 Encounter for screening mammogram for malignant neoplasm of breast: Secondary | ICD-10-CM

## 2022-10-15 ENCOUNTER — Ambulatory Visit: Payer: 59 | Admitting: Physical Therapy

## 2022-10-15 DIAGNOSIS — M5459 Other low back pain: Secondary | ICD-10-CM | POA: Diagnosis not present

## 2022-10-15 DIAGNOSIS — R531 Weakness: Secondary | ICD-10-CM | POA: Diagnosis not present

## 2022-10-15 NOTE — Therapy (Signed)
OUTPATIENT PHYSICAL THERAPY TREATMENT NOTE/RECERTIFICATION   Patient Name: Kathryn Cohen MRN: [id number] DOB:Mar 08, 1976, 47 y.o., female Today's Date: 08/27/22  END OF SESSION:  PT End of Session - 10/15/22 0930     Visit Number 12    Date for PT Re-Evaluation 12/10/22    Authorization Type [facility] [employer]    PT Start Time 0930    PT Stop Time 1010    PT Time Calculation (min) 40 min    Activity Tolerance Patient tolerated treatment well                Past Medical History:  Diagnosis Date   Allergy    Asthma    Back pain    Breast hypertrophy    Laceration of hand    rt long, ring and small fingers   Neck pain    Past Surgical History:  Procedure Laterality Date   BREAST REDUCTION SURGERY Bilateral 03/14/2017   Procedure: BILATERAL MAMMARY REDUCTION  (BREAST);  Surgeon: Glenna Fellows, MD;  Location: [facility];  Service: Plastics;  Laterality: Bilateral;   NERVE, TENDON AND ARTERY REPAIR Right 02/02/2015   Procedure: RIGHT LONG RING SMALL FINGER REPAIR NERVE, TENDON AND ARTERY REPAIR;  Surgeon: Betha Loa, MD;  Location: [facility];  Service: Orthopedics;  Laterality: Right;   REDUCTION MAMMAPLASTY Bilateral 02/2017   Patient Active Problem List   Diagnosis Date Noted   Asthma 02/24/2011   Allergic rhinitis 02/24/2011    PCP: Polite,Ronald MD  REFERRING PROVIDER: Polite,Ronald MD  REFERRING DIAG: low back pain  Rationale for Evaluation and Treatment: Rehabilitation  THERAPY DIAG:  Back pain; weakness  ONSET DATE: 04/29/2022  SUBJECTIVE:                                                                                                                                                                                           SUBJECTIVE STATEMENT: Doing better.  Feeling pretty good. Soreness in hamstring after session on 6/11 I could feel it with swimming.  Flies to [place] on Friday.  I'm not super worried.    PERTINENT  HISTORY:  Goes by Kathryn Cohen Ongoing problem related to a fall off horse at 47 years old; resolved but lifted something heavy triggered an exacerbation; then college age back spasms; got PT and acupuncture (bad experience lots of providers, lying lifted leg with weight worsened) Recent illness lying down flared up back Lots of recent travel may have aggravated Patient unable to sit for the evaluation (standing more comfortable) 40 yo daughter PAIN:  PAIN:  Are you having pain? no NPRS scale: 0/10 Pain location: feels  like a pebble at the tailbone; left low back and lateral hip  Aggravating factors: sitting > 1 hour sharp pains 7/10; walking inclines will hurt  Relieving factors: swims; stretching midback; yoga every morning; usually walking ok on level ground  PRECAUTIONS: None  WEIGHT BEARING RESTRICTIONS: No  FALLS:  Has patient fallen in last 6 months? No  LIVING ENVIRONMENT: Lives with: lives with their family Lives in: House/apartment   OCCUPATION: standing desk, tries to limit sitting  PLOF: Independent  PATIENT GOALS: reduce my pain; more functional I can barely sit at all; wants to travel;  mid June airplane [place];  need to be able to walk normal speed keep up with daughter 58 year old  NEXT MD VISIT: nothing scheduled  OBJECTIVE:   DIAGNOSTIC FINDINGS:  X-rays Mild degen lower thoracic  PATIENT SURVEYS:  Modified Oswestry: 40% moderate disability 6/4: 32%  COGNITION: Overall cognitive status: Within functional limits for tasks assessed       MUSCLE LENGTH: Hamstrings: Right 75 deg; Left 75 deg Thomas test: Right 10 deg; Left 10 deg  POSTURE: No Significant postural limitations  PALPATION: Decreased QL mobility  LUMBAR ROM:   AROM eval 6/18  Flexion Fingertips to shoes with feet together full  Extension 20 27  Right lateral flexion 20 25  Left lateral flexion 20 25  Right rotation    Left rotation     (Blank rows = not tested)  LOWER EXTREMITY ROM:    decreased right hip internal rotation ROM 5 degrees; left internal rotation 12 degrees  09/25/2022: Bilateral hip internal rotation is 25 degrees  TRUNK STRENGTH:  Decreased activation of transverse abdominus muscles; abdominals 4/5; decreased activation of lumbar multifidi; trunk extensors 4/5  Bird dog 4/5; modified plank 4/5; bent knee lift and lower= abs 4/5  6/18:  TRUNK STRENGTH GROSSLY 4+/5  LOWER EXTREMITY MMT:    MMT Right eval Left eval 6/11  Hip flexion 5 5 5   Hip extension 4 4 4+  Hip abduction 4 4 4+  Hip adduction     Hip internal rotation     Hip external rotation 4 4 4+  Knee flexion     Knee extension 5 5 5   Ankle dorsiflexion     Ankle plantarflexion     Ankle inversion     Ankle eversion      (Blank rows = not tested)  09/25/2022: Right hip flexion strength of 5-/5 Left hip flexion strength of 4+/5  6/18:  left hip flexion 5-/5  LUMBAR SPECIAL TESTS:  Straight leg raise test: Negative and Single leg stance test: Negative   GAIT: Comments: decreased gait speed  TODAY'S TREATMENT:     10/15/2022 UBE standing 5 min each way Prone over 1 pillow: pelvic press 5x, whole leg lift 5x right/left; donkey kick 5x right/left Pallof series with red band staggered stance: ABCs; small trunk rotation 10x right/left;Green band diagonal extensions (anchored over the top of the door) 10x right/left Hip hinge 5# to touch tall cone staggered stance 5x each side 4# dumbbell overhead press single and double arm 2 x5 Hip hinge 2 4# weights 3 sets of 5 Squats in front of 16 inch box holding 5# goblet style 2x5 RPE 4/10 Manual therapy soft tissue mobilization to left gluteals and bil paraspinals Trigger Point Dry-Needling  Treatment instructions: Expect mild to moderate muscle soreness. S/S of pneumothorax if dry needled over a lung field, and to seek immediate medical attention should they occur. Patient verbalized understanding of  these instructions and  education. Patient Consent Given: Yes Education handout provided: Yes Muscles treated: left gluteals/piriformis in sidelying and bil lumbar multifidi in prone Electrical stimulation performed: No Parameters: N/A Treatment response/outcome: Utilized skilled palpation to locate bony landmarks and identify trigger points.  Able to illicit twitch response and muscle elongation.      10/10/2022 Assessed for pelvic obliquity - no asymmetry noted Reverse clam and hip abduction with internal rotation Manual therapy  Patient confirms identification and approves physical therapist to perform internal soft tissue work  - rectal to coccygeus bil and sacral mobs with coccyx stabilization    10/08/2022 Prone over 1 pillow: pelvic press 5x, whole leg lift 5x right/left; donkey kick 5x right/left Pallof series with red band:  press out, press up, press out and up; marching, back lunge 5x each right/left  Hip hinge golf club 8x cues for bending knees a bit Hip hinge 5# 8x Squats in front of table holding 5# goblet style 8x Manual therapy Trigger Point Dry-Needling  Treatment instructions: Expect mild to moderate muscle soreness. S/S of pneumothorax if dry needled over a lung field, and to seek immediate medical attention should they occur. Patient verbalized understanding of these instructions and education. Patient Consent Given: Yes Education handout provided: Yes Muscles treated: left gluteals/piriformis in sidelying and bil lumbar multifidi in prone Electrical stimulation performed: No Parameters: N/A Treatment response/outcome: Utilized skilled palpation to locate bony landmarks and identify trigger points.  Able to illicit twitch response and muscle elongation.  10/01/2022 Modified Oswestry Piriformis stretch with green ball (limited ROM on left) Supine red loop single leg clam 10x right/left Bridge with red loop around thighs 10x Prone over 1 pillow: pelvic press 5x, whole leg lift 5x  right/left; donkey kick 5x right/left Manual therapy Trigger Point Dry-Needling  Treatment instructions: Expect mild to moderate muscle soreness. S/S of pneumothorax if dry needled over a lung field, and to seek immediate medical attention should they occur. Patient verbalized understanding of these instructions and education. Patient Consent Given: Yes Education handout provided: Yes Muscles treated: left gluteals/piriformis in sidelying and bil lumbar multifidi in prone Electrical stimulation performed: No Parameters: N/A Treatment response/outcome: Utilized skilled palpation to locate bony landmarks and identify trigger points.  Able to illicit twitch response and muscle elongation.       DATE: 09/26/22 Manual Trigger Point Dry-Needling  Treatment instructions: Expect mild to moderate muscle soreness. S/S of pneumothorax if dry needled over a lung field, and to seek immediate medical attention should they occur. Patient verbalized understanding of these instructions and education.  Patient Consent Given: Yes Education handout provided: Yes Muscles treated: bil coccygeus attatchments Electrical stimulation performed: No Parameters: N/A Treatment response/outcome: increased soft tissue length  Patient confirms identification and approves physical therapist to perform internal soft tissue work  - rectally performed puborectalis and coccygeus release bil, stretch to coccygeal ligament STM - stretch and compression mobs to sacrum, TPR to bil gluteals  PATIENT EDUCATION:  Education details: Educated patient on anatomy and physiology of current symptoms, prognosis, plan of care as well as initial self care strategies to promote recovery Use of lumbar roll when sitting to avoid sacral sitting Person educated: Patient Education method: Explanation Education comprehension:  verbalized understanding  HOME EXERCISE PROGRAM: Access Code: [email or web address] URL: [email or web address] Date: 09/25/2022 Prepared by: Clydie Braun Menke  Exercises - Supine Piriformis Stretch (Mirrored)  - 1 x daily - 7 x weekly - 1 sets - 3 reps - 20 hold - Clamshell  - 1 x daily - 7 x weekly - 1 sets - 10 reps - Hooklying Isometric Hip Flexion  - 1 x daily - 7 x weekly - 1 sets - 5 reps - Hooklying Sequential Leg March and Lower  - 1 x daily - 7 x weekly - 1 sets - 10 reps - Plank on Knees  - 1 x daily - 7 x weekly - 1 sets - 10 reps - Quadruped Hip Extension Kicks  - 1 x daily - 7 x weekly - 2 sets - 10 reps - Quadruped Rocking Slow  - 1 x daily - 7 x weekly - 1 sets - 10 reps - Standing Hip Internal and External Rotation AROM  - 1 x daily - 7 x weekly - 1 sets - 10 reps - Half Kneeling Hip Flexor Stretch with Tri-Planar Reach  - 1 x daily - 7 x weekly - 3 sets - 10 reps - Quadruped Cat Cow  - 1 x daily - 7 x weekly - 3 sets - 10 reps - Quadruped Thoracic Spine Extension  - 1 x daily - 7 x weekly - 3 sets - 10 reps - Quadruped Thoracic Lumbar Side Bend  - 1 x daily - 7 x weekly - 3 sets - 10 reps  ASSESSMENT:  CLINICAL IMPRESSION: Improving with pain reduction, functional outcome measures and trunk and LE strength.  She would benefit from further manual therapy with a pelvic floor PT to address coccygeal symptoms persisting.  The patient would also benefit from a continuation of skilled PT for a further progression of strengthening and functional mobility.  Will continue to update and promote independence in a HEP needed for a return to the highest functional level possible with ADLs.     OBJECTIVE IMPAIRMENTS: decreased activity tolerance, decreased mobility, difficulty walking, decreased ROM, decreased strength, increased fascial restrictions, impaired perceived functional ability, and pain.   ACTIVITY LIMITATIONS: sitting, standing, sleeping, and locomotion  level  PARTICIPATION LIMITATIONS: driving, community activity, and occupation  PERSONAL FACTORS: Past/current experiences and Time since onset of injury/illness/exacerbation are also affecting patient's functional outcome.   REHAB POTENTIAL: Good  CLINICAL DECISION MAKING: Stable/uncomplicated  EVALUATION COMPLEXITY: Low   GOALS: Goals reviewed with patient? Yes  SHORT TERM GOALS: Target date: 09/24/2022    Updated (09/18/22)   The patient will demonstrate knowledge of basic self care strategies and exercises to promote healing   Baseline: Goal status: MET  2.  The patient will report a 30% improvement in pain levels with functional activities which are currently difficult including sitting for work and traveling  (upcoming trip to [place]) Baseline:  Goal status: met 6/18 3.  The patient will have improved hip strength to at least 4+/5 needed for standing, walking inclines, walking longer distances  Baseline:  Goal status: MET on 09/25/2022  4. Improved right hip internal rotation ROM to 15 degrees needed for improved  Baseline:  Goal status: MET on 09/25/2022  LONG TERM GOALS: Target date: 12/10/2022    The patient will be independent in a safe self progression of a home exercise program to promote further recovery of function  Baseline:  Goal status: ongoing 2.  The patient will report a 70% improvement in pain levels with functional activities which are currently difficult including sitting Baseline:  6/18 50% Goal status: ongoing  3.  The patient will have improved trunk flexor and extensor muscle strength to at least 4+/5 needed for lifting medium weight objects such as grocery bags, laundry and luggage  Baseline:  Goal status: ongoing  4.  Improved sitting tolerance for flight to [place] and gait stamina for walking moderate community distances for travel Baseline:  Goal status: ongoing  5.  Modified Oswestry score improved to 30% indicating improved function with  less pain Baseline:  Goal status:ongoing partially met 32%    PLAN:  PT FREQUENCY: 2x/week  PT DURATION: 8 weeks  PLANNED INTERVENTIONS: Therapeutic exercises, Therapeutic activity, Neuromuscular re-education, Patient/Family education, Self Care, Joint mobilization, Aquatic Therapy, Dry Needling, Spinal mobilization, Cryotherapy, Moist heat, Taping, Traction, Ionotophoresis 4mg /ml Dexamethasone, Manual therapy, and Re-evaluation.  PLAN FOR NEXT SESSION: squats and dead lifts;   Pallof series to HEP red band stir the pot and ABCs;  DN  as needed; Continue with above and do pelvic floor soft tissue work and dry needling to coccyx with pelvic PT; single leg hip stability     Lavinia Sharps, PT 10/15/22 5:20 PM Phone: [phone] Fax: [phone]  [facility] [address], [address] [place], [place] [address] Phone # [phone] Fax [phone]

## 2022-10-17 ENCOUNTER — Ambulatory Visit: Payer: 59 | Admitting: Physical Therapy

## 2022-10-17 DIAGNOSIS — R531 Weakness: Secondary | ICD-10-CM

## 2022-10-17 DIAGNOSIS — M5459 Other low back pain: Secondary | ICD-10-CM | POA: Diagnosis not present

## 2022-10-17 NOTE — Therapy (Signed)
OUTPATIENT PHYSICAL THERAPY TREATMENT NOTE   Patient Name: Kathryn Cohen MRN: [id number] DOB:12/02/1975, 47 y.o., female Today's Date: 08/27/22  END OF SESSION:  PT End of Session - 10/17/22 1053     Visit Number 13    Date for PT Re-Evaluation 12/10/22    Authorization Type [facility] [employer]    PT Start Time 1018    PT Stop Time 1053    PT Time Calculation (min) 35 min    Activity Tolerance Patient tolerated treatment well    Behavior During Therapy WFL for tasks assessed/performed                 Past Medical History:  Diagnosis Date   Allergy    Asthma    Back pain    Breast hypertrophy    Laceration of hand    rt long, ring and small fingers   Neck pain    Past Surgical History:  Procedure Laterality Date   BREAST REDUCTION SURGERY Bilateral 03/14/2017   Procedure: BILATERAL MAMMARY REDUCTION  (BREAST);  Surgeon: Glenna Fellows, MD;  Location: [facility];  Service: Plastics;  Laterality: Bilateral;   NERVE, TENDON AND ARTERY REPAIR Right 02/02/2015   Procedure: RIGHT LONG RING SMALL FINGER REPAIR NERVE, TENDON AND ARTERY REPAIR;  Surgeon: Betha Loa, MD;  Location: [facility];  Service: Orthopedics;  Laterality: Right;   REDUCTION MAMMAPLASTY Bilateral 02/2017   Patient Active Problem List   Diagnosis Date Noted   Asthma 02/24/2011   Allergic rhinitis 02/24/2011    PCP: Polite,Ronald MD  REFERRING PROVIDER: Polite,Ronald MD  REFERRING DIAG: low back pain  Rationale for Evaluation and Treatment: Rehabilitation  THERAPY DIAG:  Back pain; weakness  ONSET DATE: 04/29/2022  SUBJECTIVE:                                                                                                                                                                                           SUBJECTIVE STATEMENT: Feeling pretty good. Going to [place].  I feel like I can handle that    PERTINENT HISTORY:  Goes by [name] Ongoing problem  related to a fall off horse at 47 years old; resolved but lifted something heavy triggered an exacerbation; then college age back spasms; got PT and acupuncture (bad experience lots of providers, lying lifted leg with weight worsened) Recent illness lying down flared up back Lots of recent travel may have aggravated Patient unable to sit for the evaluation (standing more comfortable) 12 yo daughter PAIN:  PAIN:  Are you having pain? no NPRS scale: 0/10 Pain location: feels like a pebble at the  tailbone; left low back and lateral hip  Aggravating factors: sitting > 1 hour sharp pains 7/10; walking inclines will hurt  Relieving factors: swims; stretching midback; yoga every morning; usually walking ok on level ground  PRECAUTIONS: None  WEIGHT BEARING RESTRICTIONS: No  FALLS:  Has patient fallen in last 6 months? No  LIVING ENVIRONMENT: Lives with: lives with their family Lives in: House/apartment   OCCUPATION: standing desk, tries to limit sitting  PLOF: Independent  PATIENT GOALS: reduce my pain; more functional I can barely sit at all; wants to travel;  mid June airplane [place];  need to be able to walk normal speed keep up with daughter 14 year old  NEXT MD VISIT: nothing scheduled  OBJECTIVE:   DIAGNOSTIC FINDINGS:  X-rays Mild degen lower thoracic  PATIENT SURVEYS:  Modified Oswestry: 40% moderate disability 6/4: 32%  COGNITION: Overall cognitive status: Within functional limits for tasks assessed       MUSCLE LENGTH: Hamstrings: Right 75 deg; Left 75 deg Thomas test: Right 10 deg; Left 10 deg  POSTURE: No Significant postural limitations  PALPATION: Decreased QL mobility  LUMBAR ROM:   AROM eval 6/18  Flexion Fingertips to shoes with feet together full  Extension 20 27  Right lateral flexion 20 25  Left lateral flexion 20 25  Right rotation    Left rotation     (Blank rows = not tested)  LOWER EXTREMITY ROM:   decreased right hip internal rotation  ROM 5 degrees; left internal rotation 12 degrees  09/25/2022: Bilateral hip internal rotation is 25 degrees  TRUNK STRENGTH:  Decreased activation of transverse abdominus muscles; abdominals 4/5; decreased activation of lumbar multifidi; trunk extensors 4/5  Bird dog 4/5; modified plank 4/5; bent knee lift and lower= abs 4/5  6/18:  TRUNK STRENGTH GROSSLY 4+/5  LOWER EXTREMITY MMT:    MMT Right eval Left eval 6/11  Hip flexion 5 5 5   Hip extension 4 4 4+  Hip abduction 4 4 4+  Hip adduction     Hip internal rotation     Hip external rotation 4 4 4+  Knee flexion     Knee extension 5 5 5   Ankle dorsiflexion     Ankle plantarflexion     Ankle inversion     Ankle eversion      (Blank rows = not tested)  09/25/2022: Right hip flexion strength of 5-/5 Left hip flexion strength of 4+/5  6/18:  left hip flexion 5-/5  LUMBAR SPECIAL TESTS:  Straight leg raise test: Negative and Single leg stance test: Negative   GAIT: Comments: decreased gait speed  TODAY'S TREATMENT:      10/17/2022 Pball roll hip hinges Manual therapy  Patient confirms identification and approves physical therapist to perform internal soft tissue work  - rectal to coccygeus bil and sacral mobs with coccyx stabilization  Bil gluteals  10/15/2022 UBE standing 5 min each way Prone over 1 pillow: pelvic press 5x, whole leg lift 5x right/left; donkey kick 5x right/left Pallof series with red band staggered stance: ABCs; small trunk rotation 10x right/left;Green band diagonal extensions (anchored over the top of the door) 10x right/left Hip hinge 5# to touch tall cone staggered stance 5x each side 4# dumbbell overhead press single and double arm 2 x5 Hip hinge 2 4# weights 3 sets of 5 Squats in front of 16 inch box holding 5# goblet style 2x5 RPE 4/10 Manual therapy soft tissue mobilization to left gluteals and bil paraspinals Trigger Point  Dry-Needling  Treatment instructions: Expect mild to moderate  muscle soreness. S/S of pneumothorax if dry needled over a lung field, and to seek immediate medical attention should they occur. Patient verbalized understanding of these instructions and education. Patient Consent Given: Yes Education handout provided: Yes Muscles treated: left gluteals/piriformis in sidelying and bil lumbar multifidi in prone Electrical stimulation performed: No Parameters: N/A Treatment response/outcome: Utilized skilled palpation to locate bony landmarks and identify trigger points.  Able to illicit twitch response and muscle elongation.      10/10/2022 Assessed for pelvic obliquity - no asymmetry noted Reverse clam and hip abduction with internal rotation Manual therapy  Patient confirms identification and approves physical therapist to perform internal soft tissue work  - rectal to coccygeus bil and sacral mobs with coccyx stabilization    10/08/2022 Prone over 1 pillow: pelvic press 5x, whole leg lift 5x right/left; donkey kick 5x right/left Pallof series with red band:  press out, press up, press out and up; marching, back lunge 5x each right/left  Hip hinge golf club 8x cues for bending knees a bit Hip hinge 5# 8x Squats in front of table holding 5# goblet style 8x Manual therapy Trigger Point Dry-Needling  Treatment instructions: Expect mild to moderate muscle soreness. S/S of pneumothorax if dry needled over a lung field, and to seek immediate medical attention should they occur. Patient verbalized understanding of these instructions and education. Patient Consent Given: Yes Education handout provided: Yes Muscles treated: left gluteals/piriformis in sidelying and bil lumbar multifidi in prone Electrical stimulation performed: No Parameters: N/A Treatment response/outcome: Utilized skilled palpation to locate bony landmarks and identify trigger points.  Able to illicit twitch response and muscle elongation.                                                                                                                          PATIENT EDUCATION:  Education details: Educated patient on anatomy and physiology of current symptoms, prognosis, plan of care as well as initial self care strategies to promote recovery Use of lumbar roll when sitting to avoid sacral sitting Person educated: Patient Education method: Explanation Education comprehension: verbalized understanding  HOME EXERCISE PROGRAM: Access Code: [email or web address] URL: [email or web address] Date: 09/25/2022 Prepared by: Clydie Braun Menke  Exercises - Supine Piriformis Stretch (Mirrored)  - 1 x daily - 7 x weekly - 1 sets - 3 reps - 20 hold - Clamshell  - 1 x daily - 7 x weekly - 1 sets - 10 reps - Hooklying Isometric Hip Flexion  - 1 x daily - 7 x weekly - 1 sets - 5 reps - Hooklying Sequential Leg March and Lower  - 1 x daily - 7 x weekly - 1 sets - 10 reps - Plank on Knees  - 1 x daily - 7 x weekly - 1 sets - 10 reps - Quadruped Hip Extension Kicks  - 1 x daily - 7 x weekly - 2 sets - 10 reps - Quadruped  Rocking Slow  - 1 x daily - 7 x weekly - 1 sets - 10 reps - Standing Hip Internal and External Rotation AROM  - 1 x daily - 7 x weekly - 1 sets - 10 reps - Half Kneeling Hip Flexor Stretch with Tri-Planar Reach  - 1 x daily - 7 x weekly - 3 sets - 10 reps - Quadruped Cat Cow  - 1 x daily - 7 x weekly - 3 sets - 10 reps - Quadruped Thoracic Spine Extension  - 1 x daily - 7 x weekly - 3 sets - 10 reps - Quadruped Thoracic Lumbar Side Bend  - 1 x daily - 7 x weekly - 3 sets - 10 reps  ASSESSMENT:  CLINICAL IMPRESSION: Improving with pain reduction reports every time getting the manual therapy.  Pt had significant release to coccygeus and obturator on left side today.  The patient would also benefit from a continuation of skilled PT for a further progression of strengthening and functional mobility.  Will continue to update and promote independence in a HEP needed for a return to  the highest functional level possible with ADLs.     OBJECTIVE IMPAIRMENTS: decreased activity tolerance, decreased mobility, difficulty walking, decreased ROM, decreased strength, increased fascial restrictions, impaired perceived functional ability, and pain.   ACTIVITY LIMITATIONS: sitting, standing, sleeping, and locomotion level  PARTICIPATION LIMITATIONS: driving, community activity, and occupation  PERSONAL FACTORS: Past/current experiences and Time since onset of injury/illness/exacerbation are also affecting patient's functional outcome.   REHAB POTENTIAL: Good  CLINICAL DECISION MAKING: Stable/uncomplicated  EVALUATION COMPLEXITY: Low   GOALS: Goals reviewed with patient? Yes  SHORT TERM GOALS: Target date: 09/24/2022    Updated (09/18/22)   The patient will demonstrate knowledge of basic self care strategies and exercises to promote healing   Baseline: Goal status: MET  2.  The patient will report a 30% improvement in pain levels with functional activities which are currently difficult including sitting for work and traveling  (upcoming trip to [place]) Baseline:  Goal status: met 6/18 3.  The patient will have improved hip strength to at least 4+/5 needed for standing, walking inclines, walking longer distances  Baseline:  Goal status: MET on 09/25/2022  4. Improved right hip internal rotation ROM to 15 degrees needed for improved  Baseline:  Goal status: MET on 09/25/2022   LONG TERM GOALS: Target date: 12/10/2022    The patient will be independent in a safe self progression of a home exercise program to promote further recovery of function  Baseline:  Goal status: ongoing 2.  The patient will report a 70% improvement in pain levels with functional activities which are currently difficult including sitting Baseline:  6/18 50% Goal status: ongoing  3.  The patient will have improved trunk flexor and extensor muscle strength to at least 4+/5 needed for lifting  medium weight objects such as grocery bags, laundry and luggage  Baseline:  Goal status: ongoing  4.  Improved sitting tolerance for flight to [place] and gait stamina for walking moderate community distances for travel Baseline:  Goal status: ongoing  5.  Modified Oswestry score improved to 30% indicating improved function with less pain Baseline:  Goal status:ongoing partially met 32%    PLAN:  PT FREQUENCY: 2x/week  PT DURATION: 8 weeks  PLANNED INTERVENTIONS: Therapeutic exercises, Therapeutic activity, Neuromuscular re-education, Patient/Family education, Self Care, Joint mobilization, Aquatic Therapy, Dry Needling, Spinal mobilization, Cryotherapy, Moist heat, Taping, Traction, Ionotophoresis 4mg /ml Dexamethasone, Manual therapy, and  Re-evaluation.  PLAN FOR NEXT SESSION: squats and dead lifts;   Pallof series to HEP red band stir the pot and ABCs;  DN  as needed; Continue with above and do pelvic floor soft tissue work and dry needling to coccyx with pelvic PT; single leg hip stability    Russella Dar, PT, DPT 10/17/22 1:17 PM   [facility] [address] [place], [place] [address] Phone # [phone] Fax [phone]

## 2022-10-28 NOTE — Therapy (Unsigned)
OUTPATIENT PHYSICAL THERAPY TREATMENT NOTE   Patient Name: Kathryn Cohen MRN: [id number] DOB:17-Mar-1976, 47 y.o., female Today's Date: 08/27/22  END OF SESSION:        Past Medical History:  Diagnosis Date   Allergy    Asthma    Back pain    Breast hypertrophy    Laceration of hand    rt long, ring and small fingers   Neck pain    Past Surgical History:  Procedure Laterality Date   BREAST REDUCTION SURGERY Bilateral 03/14/2017   Procedure: BILATERAL MAMMARY REDUCTION  (BREAST);  Surgeon: Glenna Fellows, MD;  Location:  [facility];  Service: Plastics;  Laterality: Bilateral;   NERVE, TENDON AND ARTERY REPAIR Right 02/02/2015   Procedure: RIGHT LONG RING SMALL FINGER REPAIR NERVE, TENDON AND ARTERY REPAIR;  Surgeon: Betha Loa, MD;  Location:  [facility];  Service: Orthopedics;  Laterality: Right;   REDUCTION MAMMAPLASTY Bilateral 02/2017   Patient Active Problem List   Diagnosis Date Noted   Asthma 02/24/2011   Allergic rhinitis 02/24/2011    PCP: Polite,Ronald MD  REFERRING PROVIDER: Polite,Ronald MD  REFERRING DIAG: low back pain  Rationale for Evaluation and Treatment: Rehabilitation  THERAPY DIAG:  Back pain; weakness  ONSET DATE: 04/29/2022  SUBJECTIVE:                                                                                                                                                                                           SUBJECTIVE STATEMENT: Feeling pretty good. Going to [place].  I feel like I can handle that    PERTINENT HISTORY:  Goes by [name] Ongoing problem related to a fall off horse at 47 years old; resolved but lifted something heavy triggered an exacerbation; then college age back spasms; got PT and acupuncture (bad experience lots of providers, lying lifted leg with weight worsened) Recent illness lying down flared up back Lots of recent travel may have aggravated Patient unable to sit for the  evaluation (standing more comfortable) 22 yo daughter PAIN:  PAIN:  Are you having pain? no NPRS scale: 0/10 Pain location: feels like a pebble at the tailbone; left low back and lateral hip  Aggravating factors: sitting > 1 hour sharp pains 7/10; walking inclines will hurt  Relieving factors: swims; stretching midback; yoga every morning; usually walking ok on level ground  PRECAUTIONS: None  WEIGHT BEARING RESTRICTIONS: No  FALLS:  Has patient fallen in last 6 months? No  LIVING ENVIRONMENT: Lives with: lives with their family Lives in: House/apartment   OCCUPATION: standing desk, tries to limit sitting  PLOF: Independent  PATIENT GOALS:  reduce my pain; more functional I can barely sit at all; wants to travel;  mid June airplane [place];  need to be able to walk normal speed keep up with daughter 61 year old  NEXT MD VISIT: nothing scheduled  OBJECTIVE:   DIAGNOSTIC FINDINGS:  X-rays Mild degen lower thoracic  PATIENT SURVEYS:  Modified Oswestry: 40% moderate disability 6/4: 32%  COGNITION: Overall cognitive status: Within functional limits for tasks assessed       MUSCLE LENGTH: Hamstrings: Right 75 deg; Left 75 deg Thomas test: Right 10 deg; Left 10 deg  POSTURE: No Significant postural limitations  PALPATION: Decreased QL mobility  LUMBAR ROM:   AROM eval 6/18  Flexion Fingertips to shoes with feet together full  Extension 20 27  Right lateral flexion 20 25  Left lateral flexion 20 25  Right rotation    Left rotation     (Blank rows = not tested)  LOWER EXTREMITY ROM:   decreased right hip internal rotation ROM 5 degrees; left internal rotation 12 degrees  09/25/2022: Bilateral hip internal rotation is 25 degrees  TRUNK STRENGTH:  Decreased activation of transverse abdominus muscles; abdominals 4/5; decreased activation of lumbar multifidi; trunk extensors 4/5  Bird dog 4/5; modified plank 4/5; bent knee lift and lower= abs 4/5  6/18:  TRUNK  STRENGTH GROSSLY 4+/5  LOWER EXTREMITY MMT:    MMT Right eval Left eval 6/11  Hip flexion 5 5 5   Hip extension 4 4 4+  Hip abduction 4 4 4+  Hip adduction     Hip internal rotation     Hip external rotation 4 4 4+  Knee flexion     Knee extension 5 5 5   Ankle dorsiflexion     Ankle plantarflexion     Ankle inversion     Ankle eversion      (Blank rows = not tested)  09/25/2022: Right hip flexion strength of 5-/5 Left hip flexion strength of 4+/5  6/18:  left hip flexion 5-/5  LUMBAR SPECIAL TESTS:  Straight leg raise test: Negative and Single leg stance test: Negative   GAIT: Comments: decreased gait speed  TODAY'S TREATMENT:      10/17/2022 Pball roll hip hinges Manual therapy  Patient confirms identification and approves physical therapist to perform internal soft tissue work  - rectal to coccygeus bil and sacral mobs with coccyx stabilization  Bil gluteals  10/15/2022 UBE standing 5 min each way Prone over 1 pillow: pelvic press 5x, whole leg lift 5x right/left; donkey kick 5x right/left Pallof series with red band staggered stance: ABCs; small trunk rotation 10x right/left;Green band diagonal extensions (anchored over the top of the door) 10x right/left Hip hinge 5# to touch tall cone staggered stance 5x each side 4# dumbbell overhead press single and double arm 2 x5 Hip hinge 2 4# weights 3 sets of 5 Squats in front of 16 inch box holding 5# goblet style 2x5 RPE 4/10 Manual therapy soft tissue mobilization to left gluteals and bil paraspinals Trigger Point Dry-Needling  Treatment instructions: Expect mild to moderate muscle soreness. S/S of pneumothorax if dry needled over a lung field, and to seek immediate medical attention should they occur. Patient verbalized understanding of these instructions and education. Patient Consent Given: Yes Education handout provided: Yes Muscles treated: left gluteals/piriformis in sidelying and bil lumbar multifidi in  prone Electrical stimulation performed: No Parameters: N/A Treatment response/outcome: Utilized skilled palpation to locate bony landmarks and identify trigger points.  Able to illicit twitch  response and muscle elongation.      10/10/2022 Assessed for pelvic obliquity - no asymmetry noted Reverse clam and hip abduction with internal rotation Manual therapy  Patient confirms identification and approves physical therapist to perform internal soft tissue work  - rectal to coccygeus bil and sacral mobs with coccyx stabilization    10/08/2022 Prone over 1 pillow: pelvic press 5x, whole leg lift 5x right/left; donkey kick 5x right/left Pallof series with red band:  press out, press up, press out and up; marching, back lunge 5x each right/left  Hip hinge golf club 8x cues for bending knees a bit Hip hinge 5# 8x Squats in front of table holding 5# goblet style 8x Manual therapy Trigger Point Dry-Needling  Treatment instructions: Expect mild to moderate muscle soreness. S/S of pneumothorax if dry needled over a lung field, and to seek immediate medical attention should they occur. Patient verbalized understanding of these instructions and education. Patient Consent Given: Yes Education handout provided: Yes Muscles treated: left gluteals/piriformis in sidelying and bil lumbar multifidi in prone Electrical stimulation performed: No Parameters: N/A Treatment response/outcome: Utilized skilled palpation to locate bony landmarks and identify trigger points.  Able to illicit twitch response and muscle elongation.                                                                                                                         PATIENT EDUCATION:  Education details: Educated patient on anatomy and physiology of current symptoms, prognosis, plan of care as well as initial self care strategies to promote recovery Use of lumbar roll when sitting to avoid sacral sitting Person educated:  Patient Education method: Explanation Education comprehension: verbalized understanding  HOME EXERCISE PROGRAM: Access Code: [email or web address] URL: [email or web address] Date: 09/25/2022 Prepared by: Clydie Braun Menke  Exercises - Supine Piriformis Stretch (Mirrored)  - 1 x daily - 7 x weekly - 1 sets - 3 reps - 20 hold - Clamshell  - 1 x daily - 7 x weekly - 1 sets - 10 reps - Hooklying Isometric Hip Flexion  - 1 x daily - 7 x weekly - 1 sets - 5 reps - Hooklying Sequential Leg March and Lower  - 1 x daily - 7 x weekly - 1 sets - 10 reps - Plank on Knees  - 1 x daily - 7 x weekly - 1 sets - 10 reps - Quadruped Hip Extension Kicks  - 1 x daily - 7 x weekly - 2 sets - 10 reps - Quadruped Rocking Slow  - 1 x daily - 7 x weekly - 1 sets - 10 reps - Standing Hip Internal and External Rotation AROM  - 1 x daily - 7 x weekly - 1 sets - 10 reps - Half Kneeling Hip Flexor Stretch with Tri-Planar Reach  - 1 x daily - 7 x weekly - 3 sets - 10 reps - Quadruped Cat Cow  - 1 x daily - 7 x weekly - 3 sets - 10  reps - Quadruped Thoracic Spine Extension  - 1 x daily - 7 x weekly - 3 sets - 10 reps - Quadruped Thoracic Lumbar Side Bend  - 1 x daily - 7 x weekly - 3 sets - 10 reps  ASSESSMENT:  CLINICAL IMPRESSION: Improving with pain reduction reports every time getting the manual therapy.  Pt had significant release to coccygeus and obturator on left side today.  The patient would also benefit from a continuation of skilled PT for a further progression of strengthening and functional mobility.  Will continue to update and promote independence in a HEP needed for a return to the highest functional level possible with ADLs.     OBJECTIVE IMPAIRMENTS: decreased activity tolerance, decreased mobility, difficulty walking, decreased ROM, decreased strength, increased fascial restrictions, impaired perceived functional ability, and pain.   ACTIVITY LIMITATIONS: sitting, standing, sleeping, and locomotion  level  PARTICIPATION LIMITATIONS: driving, community activity, and occupation  PERSONAL FACTORS: Past/current experiences and Time since onset of injury/illness/exacerbation are also affecting patient's functional outcome.   REHAB POTENTIAL: Good  CLINICAL DECISION MAKING: Stable/uncomplicated  EVALUATION COMPLEXITY: Low   GOALS: Goals reviewed with patient? Yes  SHORT TERM GOALS: Target date: 09/24/2022    Updated (09/18/22)   The patient will demonstrate knowledge of basic self care strategies and exercises to promote healing   Baseline: Goal status: MET  2.  The patient will report a 30% improvement in pain levels with functional activities which are currently difficult including sitting for work and traveling  (upcoming trip to [place]) Baseline:  Goal status: met 6/18 3.  The patient will have improved hip strength to at least 4+/5 needed for standing, walking inclines, walking longer distances  Baseline:  Goal status: MET on 09/25/2022  4. Improved right hip internal rotation ROM to 15 degrees needed for improved  Baseline:  Goal status: MET on 09/25/2022   LONG TERM GOALS: Target date: 12/10/2022    The patient will be independent in a safe self progression of a home exercise program to promote further recovery of function  Baseline:  Goal status: ongoing 2.  The patient will report a 70% improvement in pain levels with functional activities which are currently difficult including sitting Baseline:  6/18 50% Goal status: ongoing  3.  The patient will have improved trunk flexor and extensor muscle strength to at least 4+/5 needed for lifting medium weight objects such as grocery bags, laundry and luggage  Baseline:  Goal status: ongoing  4.  Improved sitting tolerance for flight to [place] and gait stamina for walking moderate community distances for travel Baseline:  Goal status: ongoing  5.  Modified Oswestry score improved to 30% indicating improved function with  less pain Baseline:  Goal status:ongoing partially met 32%    PLAN:  PT FREQUENCY: 2x/week  PT DURATION: 8 weeks  PLANNED INTERVENTIONS: Therapeutic exercises, Therapeutic activity, Neuromuscular re-education, Patient/Family education, Self Care, Joint mobilization, Aquatic Therapy, Dry Needling, Spinal mobilization, Cryotherapy, Moist heat, Taping, Traction, Ionotophoresis 4mg /ml Dexamethasone, Manual therapy, and Re-evaluation.  PLAN FOR NEXT SESSION: squats and dead lifts;   Pallof series to HEP red band stir the pot and ABCs;  DN  as needed; Continue with above and do pelvic floor soft tissue work and dry needling to coccyx with pelvic PT; single leg hip stability    Russella Dar, PT, DPT 10/28/22 12:36 PM   [facility] [address], [address] [place], [place] [address] Phone # [phone] Fax [phone]

## 2022-10-29 ENCOUNTER — Ambulatory Visit: Payer: 59 | Attending: Internal Medicine | Admitting: Physical Therapy

## 2022-10-29 ENCOUNTER — Encounter: Payer: Self-pay | Admitting: Physical Therapy

## 2022-10-29 DIAGNOSIS — M5459 Other low back pain: Secondary | ICD-10-CM | POA: Diagnosis not present

## 2022-10-29 DIAGNOSIS — F4322 Adjustment disorder with anxiety: Secondary | ICD-10-CM | POA: Diagnosis not present

## 2022-10-29 DIAGNOSIS — R531 Weakness: Secondary | ICD-10-CM | POA: Insufficient documentation

## 2022-11-04 NOTE — Therapy (Unsigned)
OUTPATIENT PHYSICAL THERAPY TREATMENT NOTE   Patient Name: Kathryn Cohen MRN: [id number] DOB:04-Jan-1976, 47 y.o., female Today's Date: 08/27/22  END OF SESSION:         Past Medical History:  Diagnosis Date   Allergy    Asthma    Back pain    Breast hypertrophy    Laceration of hand    rt long, ring and small fingers   Neck pain    Past Surgical History:  Procedure Laterality Date   BREAST REDUCTION SURGERY Bilateral 03/14/2017   Procedure: BILATERAL MAMMARY REDUCTION  (BREAST);  Surgeon: Glenna Fellows, MD;  Location: [facility];  Service: Plastics;  Laterality: Bilateral;   NERVE, TENDON AND ARTERY REPAIR Right 02/02/2015   Procedure: RIGHT LONG RING SMALL FINGER REPAIR NERVE, TENDON AND ARTERY REPAIR;  Surgeon: Betha Loa, MD;  Location: [facility];  Service: Orthopedics;  Laterality: Right;   REDUCTION MAMMAPLASTY Bilateral 02/2017   Patient Active Problem List   Diagnosis Date Noted   Asthma 02/24/2011   Allergic rhinitis 02/24/2011    PCP: Polite,Ronald MD  REFERRING PROVIDER: Polite,Ronald MD  REFERRING DIAG: low back pain  Rationale for Evaluation and Treatment: Rehabilitation  THERAPY DIAG:  Back pain; weakness  ONSET DATE: 04/29/2022  SUBJECTIVE:                                                                                                                                                                                           SUBJECTIVE STATEMENT: Feeling pretty good. Did well on the trip and no major pain or inability to travel.     PERTINENT HISTORY:  Goes by Kathryn Cohen Ongoing problem related to a fall off horse at 47 years old; resolved but lifted something heavy triggered an exacerbation; then college age back spasms; got PT and acupuncture (bad experience lots of providers, lying lifted leg with weight worsened) Recent illness lying down flared up back Lots of recent travel may have aggravated Patient  unable to sit for the evaluation (standing more comfortable) 31 yo daughter PAIN:  PAIN:  Are you having pain? No (just irritation) NPRS scale: 3/10 Pain location: feels like a pebble at the tailbone; left low back and lateral hip  Aggravating factors: sitting > 1 hour sharp pains 7/10; walking inclines will hurt  Relieving factors: swims; stretching midback; yoga every morning; usually walking ok on level ground  PRECAUTIONS: None  WEIGHT BEARING RESTRICTIONS: No  FALLS:  Has patient fallen in last 6 months? No  LIVING ENVIRONMENT: Lives with: lives with their family Lives in: House/apartment   OCCUPATION: standing desk, tries to limit sitting  PLOF: Independent  PATIENT GOALS: reduce my pain; more functional I can barely sit at all; wants to travel;  mid June airplane [place];  need to be able to walk normal speed keep up with daughter 45 year old  NEXT MD VISIT: nothing scheduled  OBJECTIVE:   DIAGNOSTIC FINDINGS:  X-rays Mild degen lower thoracic  PATIENT SURVEYS:  Modified Oswestry: 40% moderate disability 6/4: 32%  COGNITION: Overall cognitive status: Within functional limits for tasks assessed       MUSCLE LENGTH: Hamstrings: Right 75 deg; Left 75 deg Thomas test: Right 10 deg; Left 10 deg  POSTURE: No Significant postural limitations  PALPATION: Decreased QL mobility  LUMBAR ROM:   AROM eval 6/18  Flexion Fingertips to shoes with feet together full  Extension 20 27  Right lateral flexion 20 25  Left lateral flexion 20 25  Right rotation    Left rotation     (Blank rows = not tested)  LOWER EXTREMITY ROM:   decreased right hip internal rotation ROM 5 degrees; left internal rotation 12 degrees  09/25/2022: Bilateral hip internal rotation is 25 degrees  TRUNK STRENGTH:  Decreased activation of transverse abdominus muscles; abdominals 4/5; decreased activation of lumbar multifidi; trunk extensors 4/5  Bird dog 4/5; modified plank 4/5; bent knee lift  and lower= abs 4/5  6/18:  TRUNK STRENGTH GROSSLY 4+/5  LOWER EXTREMITY MMT:    MMT Right eval Left eval 6/11  Hip flexion 5 5 5   Hip extension 4 4 4+  Hip abduction 4 4 4+  Hip adduction     Hip internal rotation     Hip external rotation 4 4 4+  Knee flexion     Knee extension 5 5 5   Ankle dorsiflexion     Ankle plantarflexion     Ankle inversion     Ankle eversion      (Blank rows = not tested)  09/25/2022: Right hip flexion strength of 5-/5 Left hip flexion strength of 4+/5  6/18:  left hip flexion 5-/5  LUMBAR SPECIAL TESTS:  Straight leg raise test: Negative and Single leg stance test: Negative   GAIT: Comments: decreased gait speed  TODAY'S TREATMENT:      10/29/2022 Exercise and NMRE Single leg dead lifts with weight - 2#/side Dead lift weights 2#/side Single leg on foam x 2 min Single leg on flat surface with rebounder yellow ball Vibration plate h/s stretch and lunges - x 60 sec  Manual therapy  Patient confirms identification and approves physical therapist to perform internal soft tissue work  - rectal to coccygeus bil and sacral mobs with coccyx stabilization  Bil gluteals and lumbar paraspinals Trigger Point Dry-Needling  Treatment instructions: Expect mild to moderate muscle soreness. S/S of pneumothorax if dry needled over a lung field, and to seek immediate medical attention should they occur. Patient verbalized understanding of these instructions and education.  Patient Consent Given: Yes Education handout provided: Previously provided Muscles treated: lumbar, piriformis, glute med Electrical stimulation performed: No Parameters: N/A Treatment response/outcome: increased soft tissue length and twitches palpated    10/17/2022 Pball roll hip hinges Manual therapy  Patient confirms identification and approves physical therapist to perform internal soft tissue work  - rectal to coccygeus bil and sacral mobs with coccyx stabilization  Bil  gluteals  10/15/2022 UBE standing 5 min each way Prone over 1 pillow: pelvic press 5x, whole leg lift 5x right/left; donkey kick 5x right/left Pallof series with red band staggered stance: ABCs; small  trunk rotation 10x right/left;Green band diagonal extensions (anchored over the top of the door) 10x right/left Hip hinge 5# to touch tall cone staggered stance 5x each side 4# dumbbell overhead press single and double arm 2 x5 Hip hinge 2 4# weights 3 sets of 5 Squats in front of 16 inch box holding 5# goblet style 2x5 RPE 4/10 Manual therapy soft tissue mobilization to left gluteals and bil paraspinals Trigger Point Dry-Needling  Treatment instructions: Expect mild to moderate muscle soreness. S/S of pneumothorax if dry needled over a lung field, and to seek immediate medical attention should they occur. Patient verbalized understanding of these instructions and education. Patient Consent Given: Yes Education handout provided: Yes Muscles treated: left gluteals/piriformis in sidelying and bil lumbar multifidi in prone Electrical stimulation performed: No Parameters: N/A Treatment response/outcome: Utilized skilled palpation to locate bony landmarks and identify trigger points.  Able to illicit twitch response and muscle elongation.      10/10/2022 Assessed for pelvic obliquity - no asymmetry noted Reverse clam and hip abduction with internal rotation Manual therapy  Patient confirms identification and approves physical therapist to perform internal soft tissue work  - rectal to coccygeus bil and sacral mobs with coccyx stabilization                                                                                                                            PATIENT EDUCATION:  Education details: Educated patient on anatomy and physiology of current symptoms, prognosis, plan of care as well as initial self care strategies to promote recovery Use of lumbar roll when sitting to avoid  sacral sitting Person educated: Patient Education method: Explanation Education comprehension: verbalized understanding  HOME EXERCISE PROGRAM: Access Code: [email or web address] URL: [email or web address] Date: 09/25/2022 Prepared by: Clydie Braun Menke  Exercises - Supine Piriformis Stretch (Mirrored)  - 1 x daily - 7 x weekly - 1 sets - 3 reps - 20 hold - Clamshell  - 1 x daily - 7 x weekly - 1 sets - 10 reps - Hooklying Isometric Hip Flexion  - 1 x daily - 7 x weekly - 1 sets - 5 reps - Hooklying Sequential Leg March and Lower  - 1 x daily - 7 x weekly - 1 sets - 10 reps - Plank on Knees  - 1 x daily - 7 x weekly - 1 sets - 10 reps - Quadruped Hip Extension Kicks  - 1 x daily - 7 x weekly - 2 sets - 10 reps - Quadruped Rocking Slow  - 1 x daily - 7 x weekly - 1 sets - 10 reps - Standing Hip Internal and External Rotation AROM  - 1 x daily - 7 x weekly - 1 sets - 10 reps - Half Kneeling Hip Flexor Stretch with Tri-Planar Reach  - 1 x daily - 7 x weekly - 3 sets - 10 reps - Quadruped Cat Cow  - 1 x daily - 7 x weekly - 3  sets - 10 reps - Quadruped Thoracic Spine Extension  - 1 x daily - 7 x weekly - 3 sets - 10 reps - Quadruped Thoracic Lumbar Side Bend  - 1 x daily - 7 x weekly - 3 sets - 10 reps  ASSESSMENT:  CLINICAL IMPRESSION: Improving with pain reduction reports every time getting the manual therapy and pt states they can do more.  Pt had more tension in puborectalis and coccygeus muscles today but tension was more even to both right and left sides.  Pt was able to work on hip hinging and single leg exercises with success and good technique and exercises appeared challenging.   Will continue to update and promote independence in a HEP needed for a return to the highest functional level possible with ADLs.     OBJECTIVE IMPAIRMENTS: decreased activity tolerance, decreased mobility, difficulty walking, decreased ROM, decreased strength, increased fascial restrictions, impaired  perceived functional ability, and pain.   ACTIVITY LIMITATIONS: sitting, standing, sleeping, and locomotion level  PARTICIPATION LIMITATIONS: driving, community activity, and occupation  PERSONAL FACTORS: Past/current experiences and Time since onset of injury/illness/exacerbation are also affecting patient's functional outcome.   REHAB POTENTIAL: Good  CLINICAL DECISION MAKING: Stable/uncomplicated  EVALUATION COMPLEXITY: Low   GOALS: Goals reviewed with patient? Yes  SHORT TERM GOALS: Target date: 09/24/2022    Updated (09/18/22)   The patient will demonstrate knowledge of basic self care strategies and exercises to promote healing   Baseline: Goal status: MET  2.  The patient will report a 30% improvement in pain levels with functional activities which are currently difficult including sitting for work and traveling  (upcoming trip to [place]) Baseline:  Goal status: met 6/18 3.  The patient will have improved hip strength to at least 4+/5 needed for standing, walking inclines, walking longer distances  Baseline:  Goal status: MET on 09/25/2022  4. Improved right hip internal rotation ROM to 15 degrees needed for improved  Baseline:  Goal status: MET on 09/25/2022   LONG TERM GOALS: Target date: 12/10/2022  Updated 10/29/22   The patient will be independent in a safe self progression of a home exercise program to promote further recovery of function  Baseline:  Goal status: ongoing 2.  The patient will report a 70% improvement in pain levels with functional activities which are currently difficult including sitting Baseline:  6/18 50% Goal status: ongoing  3.  The patient will have improved trunk flexor and extensor muscle strength to at least 4+/5 needed for lifting medium weight objects such as grocery bags, laundry and luggage  Baseline: working on technique with dead lift and single leg hinges Goal status: ongoing  4.  Improved sitting tolerance for flight to [place] and  gait stamina for walking moderate community distances for travel Baseline: able to travel to [place] and did well with that Goal status: met  5.  Modified Oswestry score improved to 30% indicating improved function with less pain Baseline:  Goal status:ongoing partially met 32%    PLAN:  PT FREQUENCY: 2x/week  PT DURATION: 8 weeks  PLANNED INTERVENTIONS: Therapeutic exercises, Therapeutic activity, Neuromuscular re-education, Patient/Family education, Self Care, Joint mobilization, Aquatic Therapy, Dry Needling, Spinal mobilization, Cryotherapy, Moist heat, Taping, Traction, Ionotophoresis 4mg /ml Dexamethasone, Manual therapy, and Re-evaluation.  PLAN FOR NEXT SESSION: squats and dead lifts;   Pallof series to HEP red band stir the pot and ABCs;  DN  as needed; Continue with above and do pelvic floor soft tissue work and dry needling  to coccyx with pelvic PT; single leg hip stability; re-assess Oswestry    Russella Dar, PT, DPT 11/04/22 5:59 PM   [facility] [address] [place], [place] [address] Phone # [phone] Fax [phone]

## 2022-11-05 ENCOUNTER — Ambulatory Visit: Payer: 59 | Admitting: Physical Therapy

## 2022-11-05 ENCOUNTER — Encounter: Payer: Self-pay | Admitting: Physical Therapy

## 2022-11-05 DIAGNOSIS — M5459 Other low back pain: Secondary | ICD-10-CM | POA: Diagnosis not present

## 2022-11-05 DIAGNOSIS — R531 Weakness: Secondary | ICD-10-CM

## 2022-11-11 NOTE — Therapy (Unsigned)
OUTPATIENT PHYSICAL THERAPY TREATMENT NOTE   Patient Name: Kathryn Cohen MRN: [id number] DOB:[birth year], 47 y.o., female Today's Date: 08/27/22  END OF SESSION:          Past Medical History:  Diagnosis Date   Allergy    Asthma    Back pain    Breast hypertrophy    Laceration of hand    rt long, ring and small fingers   Neck pain    Past Surgical History:  Procedure Laterality Date   BREAST REDUCTION SURGERY Bilateral 03/14/2017   Procedure: BILATERAL MAMMARY REDUCTION  (BREAST);  Surgeon: Kathryn Fellows, MD;  Location: [facility];  Service: Plastics;  Laterality: Bilateral;   NERVE, TENDON AND ARTERY REPAIR Right 02/02/2015   Procedure: RIGHT LONG RING SMALL FINGER REPAIR NERVE, TENDON AND ARTERY REPAIR;  Surgeon: Kathryn Loa, MD;  Location: [facility];  Service: Orthopedics;  Laterality: Right;   REDUCTION MAMMAPLASTY Bilateral 02/2017   Patient Active Problem List   Diagnosis Date Noted   Asthma 02/24/2011   Allergic rhinitis 02/24/2011    PCP: Cohen,Ronald MD  REFERRING PROVIDER: Polite,Ronald MD  REFERRING DIAG: low back pain  Rationale for Evaluation and Treatment: Rehabilitation  THERAPY DIAG:  Back pain; weakness  ONSET DATE: 04/29/2022  SUBJECTIVE:                                                                                                                                                                                           SUBJECTIVE STATEMENT: Feeling pretty good. No changes    PERTINENT HISTORY:  Goes by Kathryn Cohen Ongoing problem related to a fall off horse at 47 years old; resolved but lifted something heavy triggered an exacerbation; then college age back spasms; got PT and acupuncture (bad experience lots of providers, lying lifted leg with weight worsened) Recent illness lying down flared up back Lots of recent travel may have aggravated Patient unable to sit for the evaluation (standing more  comfortable) 103 yo daughter PAIN:  PAIN:  Are you having pain? Yes just a little, didn't do stretches this morning NPRS scale: 1/10 Pain location: feels like a pebble at the tailbone; left low back and lateral hip  Aggravating factors: sitting > 1 hour sharp pains 7/10; walking inclines will hurt  Relieving factors: swims; stretching midback; yoga every morning; usually walking ok on level ground  PRECAUTIONS: None  WEIGHT BEARING RESTRICTIONS: No  FALLS:  Has patient fallen in last 6 months? No  LIVING ENVIRONMENT: Lives with: lives with their family Lives in: House/apartment   OCCUPATION: standing desk, tries to limit sitting  PLOF: Independent  PATIENT  GOALS: reduce my pain; more functional I can barely sit at all; wants to travel;  mid June airplane [place];  need to be able to walk normal speed keep up with daughter 3 year old  NEXT MD VISIT: nothing scheduled  OBJECTIVE:   DIAGNOSTIC FINDINGS:  X-rays Mild degen lower thoracic  PATIENT SURVEYS:  Modified Oswestry: 40% moderate disability 6/4: 32% 7/9: 8/50 = 16%  COGNITION: Overall cognitive status: Within functional limits for tasks assessed       MUSCLE LENGTH: Hamstrings: Right 75 deg; Left 75 deg Thomas test: Right 10 deg; Left 10 deg  POSTURE: No Significant postural limitations  PALPATION: Decreased QL mobility  LUMBAR ROM:   AROM eval 6/18  Flexion Fingertips to shoes with feet together full  Extension 20 27  Right lateral flexion 20 25  Left lateral flexion 20 25  Right rotation    Left rotation     (Blank rows = not tested)  LOWER EXTREMITY ROM:   decreased right hip internal rotation ROM 5 degrees; left internal rotation 12 degrees  09/25/2022: Bilateral hip internal rotation is 25 degrees  TRUNK STRENGTH:  Decreased activation of transverse abdominus muscles; abdominals 4/5; decreased activation of lumbar multifidi; trunk extensors 4/5  Bird dog 4/5; modified plank 4/5; bent knee lift  and lower= abs 4/5  6/18:  TRUNK STRENGTH GROSSLY 4+/5  LOWER EXTREMITY MMT:    MMT Right eval Left eval 6/11  Hip flexion 5 5 5   Hip extension 4 4 4+  Hip abduction 4 4 4+  Hip adduction     Hip internal rotation     Hip external rotation 4 4 4+  Knee flexion     Knee extension 5 5 5   Ankle dorsiflexion     Ankle plantarflexion     Ankle inversion     Ankle eversion      (Blank rows = not tested)  09/25/2022: Right hip flexion strength of 5-/5 Left hip flexion strength of 4+/5  6/18:  left hip flexion 5-/5  LUMBAR SPECIAL TESTS:  Straight leg raise test: Negative and Single leg stance test: Negative   GAIT: Comments: decreased gait speed  TODAY'S TREATMENT:      11/05/2022 Exercise and NMRE Single leg dead lifts with weight - 10# kettle bell Dead lift weights 10 and 15# kettle bell with legs against step  Hip machine 40lb - hip abduction 15x bil Leg press machine single leg - 30lb - 2x10 March holding 10# kettle bell Hip flexion stretch on step 20x  Manual therapy  Patient confirms identification and approves physical therapist to perform internal soft tissue work  - rectal to coccygeus bil and sacral mobs with coccyx stabilization  Bil gluteals and lumbar paraspinals Trigger Point Dry-Needling  Treatment instructions: Expect mild to moderate muscle soreness. S/S of pneumothorax if dry needled over a lung field, and to seek immediate medical attention should they occur. Patient verbalized understanding of these instructions and education.  Patient Consent Given: Yes Education handout provided: Previously provided Muscles treated: bil lumbar paraspinals and bil glute med Electrical stimulation performed: No Parameters: N/A Treatment response/outcome: increased soft tissue length  10/29/2022 Exercise and NMRE Single leg dead lifts with weight - 2#/side Dead lift weights 2#/side Single leg on foam x 2 min Single leg on flat surface with rebounder yellow  ball Vibration plate h/s stretch and lunges - x 60 sec  Manual therapy  Patient confirms identification and approves physical therapist to perform internal soft tissue  work  - rectal to coccygeus bil and sacral mobs with coccyx stabilization  Bil gluteals and lumbar paraspinals Trigger Point Dry-Needling  Treatment instructions: Expect mild to moderate muscle soreness. S/S of pneumothorax if dry needled over a lung field, and to seek immediate medical attention should they occur. Patient verbalized understanding of these instructions and education.  Patient Consent Given: Yes Education handout provided: Previously provided Muscles treated: lumbar, piriformis, glute med Electrical stimulation performed: No Parameters: N/A Treatment response/outcome: increased soft tissue length and twitches palpated    10/17/2022 Pball roll hip hinges Manual therapy  Patient confirms identification and approves physical therapist to perform internal soft tissue work  - rectal to coccygeus bil and sacral mobs with coccyx stabilization  Bil gluteals  10/15/2022 UBE standing 5 min each way Prone over 1 pillow: pelvic press 5x, whole leg lift 5x right/left; donkey kick 5x right/left Pallof series with red band staggered stance: ABCs; small trunk rotation 10x right/left;Green band diagonal extensions (anchored over the top of the door) 10x right/left Hip hinge 5# to touch tall cone staggered stance 5x each side 4# dumbbell overhead press single and double arm 2 x5 Hip hinge 2 4# weights 3 sets of 5 Squats in front of 16 inch box holding 5# goblet style 2x5 RPE 4/10 Manual therapy soft tissue mobilization to left gluteals and bil paraspinals Trigger Point Dry-Needling  Treatment instructions: Expect mild to moderate muscle soreness. S/S of pneumothorax if dry needled over a lung field, and to seek immediate medical attention should they occur. Patient verbalized understanding of these instructions and  education. Patient Consent Given: Yes Education handout provided: Yes Muscles treated: left gluteals/piriformis in sidelying and bil lumbar multifidi in prone Electrical stimulation performed: No Parameters: N/A Treatment response/outcome: Utilized skilled palpation to locate bony landmarks and identify trigger points.  Able to illicit twitch response and muscle elongation.                                                                                                                            PATIENT EDUCATION:  Education details: Educated patient on anatomy and physiology of current symptoms, prognosis, plan of care as well as initial self care strategies to promote recovery Use of lumbar roll when sitting to avoid sacral sitting Person educated: Patient Education method: Explanation Education comprehension: verbalized understanding  HOME EXERCISE PROGRAM: Access Code: [email or web address] URL: [email or web address] Date: 09/25/2022 Prepared by: Clydie Braun Menke  Exercises - Supine Piriformis Stretch (Mirrored)  - 1 x daily - 7 x weekly - 1 sets - 3 reps - 20 hold - Clamshell  - 1 x daily - 7 x weekly - 1 sets - 10 reps - Hooklying Isometric Hip Flexion  - 1 x daily - 7 x weekly - 1 sets - 5 reps - Hooklying Sequential Leg March and Lower  - 1 x daily - 7 x weekly - 1 sets - 10 reps - Plank on Knees  - 1 x daily -  7 x weekly - 1 sets - 10 reps - Quadruped Hip Extension Kicks  - 1 x daily - 7 x weekly - 2 sets - 10 reps - Quadruped Rocking Slow  - 1 x daily - 7 x weekly - 1 sets - 10 reps - Standing Hip Internal and External Rotation AROM  - 1 x daily - 7 x weekly - 1 sets - 10 reps - Half Kneeling Hip Flexor Stretch with Tri-Planar Reach  - 1 x daily - 7 x weekly - 3 sets - 10 reps - Quadruped Cat Cow  - 1 x daily - 7 x weekly - 3 sets - 10 reps - Quadruped Thoracic Spine Extension  - 1 x daily - 7 x weekly - 3 sets - 10 reps - Quadruped Thoracic Lumbar Side Bend  - 1 x  daily - 7 x weekly - 3 sets - 10 reps  ASSESSMENT:  CLINICAL IMPRESSION: Pt has met several long term goals including oswestry score and is getting better with functional lifting.  Pt still has avoidence of certain movements due to fear of the pain, but this is definitely improving and pt able to lift more weight.  Pt report feeling 60% improved but still not back to baseline.  Will continue to update and promote independence in a HEP needed for a return to the highest functional level possible with ADLs.     OBJECTIVE IMPAIRMENTS: decreased activity tolerance, decreased mobility, difficulty walking, decreased ROM, decreased strength, increased fascial restrictions, impaired perceived functional ability, and pain.   ACTIVITY LIMITATIONS: sitting, standing, sleeping, and locomotion level  PARTICIPATION LIMITATIONS: driving, community activity, and occupation  PERSONAL FACTORS: Past/current experiences and Time since onset of injury/illness/exacerbation are also affecting patient's functional outcome.   REHAB POTENTIAL: Good  CLINICAL DECISION MAKING: Stable/uncomplicated  EVALUATION COMPLEXITY: Low   GOALS: Goals reviewed with patient? Yes  SHORT TERM GOALS: Target date: 09/24/2022    Updated (09/18/22)   The patient will demonstrate knowledge of basic self care strategies and exercises to promote healing   Baseline: Goal status: MET  2.  The patient will report a 30% improvement in pain levels with functional activities which are currently difficult including sitting for work and traveling  (upcoming trip to [place]) Baseline:  Goal status: met 6/18 3.  The patient will have improved hip strength to at least 4+/5 needed for standing, walking inclines, walking longer distances  Baseline:  Goal status: MET on 09/25/2022  4. Improved right hip internal rotation ROM to 15 degrees needed for improved  Baseline:  Goal status: MET on 09/25/2022   LONG TERM GOALS: Target date:  12/10/2022  Updated 11/05/22   The patient will be independent in a safe self progression of a home exercise program to promote further recovery of function  Baseline:  Goal status: ongoing 2.  The patient will report a 70% improvement in pain levels with functional activities which are currently difficult including sitting Baseline:   60% Goal status: ongoing  3.  The patient will have improved trunk flexor and extensor muscle strength to at least 4+/5 needed for lifting medium weight objects such as grocery bags, laundry and luggage  Baseline:  Goal status: ongoing  4.  Improved sitting tolerance for flight to [place] and gait stamina for walking moderate community distances for travel Baseline: able to travel to [place] and did well with that Goal status: MET  5.  Modified Oswestry score improved to 30% indicating improved function with less pain  Baseline: 16% Goal status:MET    PLAN:  PT FREQUENCY: 2x/week  PT DURATION: 8 weeks  PLANNED INTERVENTIONS: Therapeutic exercises, Therapeutic activity, Neuromuscular re-education, Patient/Family education, Self Care, Joint mobilization, Aquatic Therapy, Dry Needling, Spinal mobilization, Cryotherapy, Moist heat, Taping, Traction, Ionotophoresis 4mg /ml Dexamethasone, Manual therapy, and Re-evaluation.  PLAN FOR NEXT SESSION: squats and dead lifts;   Pallof series to HEP red band stir the pot and ABCs;  DN  as needed; Continue with above and do pelvic floor soft tissue work and dry needling to coccyx with pelvic PT; single leg hip stability; re-assess Oswestry    Russella Dar, PT, DPT 11/11/22 2:15 PM   [facility] [address], [address] [place], [place] [address] Phone # [phone] Fax [phone]

## 2022-11-12 ENCOUNTER — Encounter: Payer: Self-pay | Admitting: Physical Therapy

## 2022-11-12 ENCOUNTER — Ambulatory Visit: Payer: 59 | Admitting: Physical Therapy

## 2022-11-12 DIAGNOSIS — M5459 Other low back pain: Secondary | ICD-10-CM | POA: Diagnosis not present

## 2022-11-12 DIAGNOSIS — R531 Weakness: Secondary | ICD-10-CM

## 2022-11-19 ENCOUNTER — Other Ambulatory Visit (HOSPITAL_COMMUNITY): Payer: Self-pay

## 2022-11-19 DIAGNOSIS — F4322 Adjustment disorder with anxiety: Secondary | ICD-10-CM | POA: Diagnosis not present

## 2022-11-19 MED ORDER — NORETHIN-ETH ESTRADIOL-FE 0.8-25 MG-MCG PO CHEW
1.0000 | CHEWABLE_TABLET | Freq: Every day | ORAL | 0 refills | Status: DC
  Filled 2022-11-19: qty 28, 24d supply, fill #0

## 2022-11-20 ENCOUNTER — Other Ambulatory Visit (HOSPITAL_COMMUNITY): Payer: Self-pay

## 2022-11-22 ENCOUNTER — Ambulatory Visit: Payer: 59 | Admitting: Physical Therapy

## 2022-11-22 ENCOUNTER — Encounter: Payer: Self-pay | Admitting: Physical Therapy

## 2022-11-22 DIAGNOSIS — R531 Weakness: Secondary | ICD-10-CM

## 2022-11-22 DIAGNOSIS — M5459 Other low back pain: Secondary | ICD-10-CM

## 2022-11-22 NOTE — Therapy (Signed)
OUTPATIENT PHYSICAL THERAPY TREATMENT NOTE   Patient Name: Kathryn Cohen MRN: [id number] DOB:11-27-1975, 47 y.o., female Today's Date: 08/27/22  END OF SESSION:  PT End of Session - 11/22/22 1024     Visit Number 17    Date for PT Re-Evaluation 12/10/22    Authorization Type [facility] [employer]    PT Start Time 1020    PT Stop Time 1100    PT Time Calculation (min) 40 min    Activity Tolerance Patient tolerated treatment well    Behavior During Therapy WFL for tasks assessed/performed                     Past Medical History:  Diagnosis Date   Allergy    Asthma    Back pain    Breast hypertrophy    Laceration of hand    rt long, ring and small fingers   Neck pain    Past Surgical History:  Procedure Laterality Date   BREAST REDUCTION SURGERY Bilateral 03/14/2017   Procedure: BILATERAL MAMMARY REDUCTION  (BREAST);  Surgeon: Glenna Fellows, MD;  Location: [facility];  Service: Plastics;  Laterality: Bilateral;   NERVE, TENDON AND ARTERY REPAIR Right 02/02/2015   Procedure: RIGHT LONG RING SMALL FINGER REPAIR NERVE, TENDON AND ARTERY REPAIR;  Surgeon: Betha Loa, MD;  Location: [facility];  Service: Orthopedics;  Laterality: Right;   REDUCTION MAMMAPLASTY Bilateral 02/2017   Patient Active Problem List   Diagnosis Date Noted   Asthma 02/24/2011   Allergic rhinitis 02/24/2011    PCP: Polite,Ronald MD  REFERRING PROVIDER: Polite,Ronald MD  REFERRING DIAG: low back pain  Rationale for Evaluation and Treatment: Rehabilitation  THERAPY DIAG:  Back pain; weakness  ONSET DATE: 04/29/2022  SUBJECTIVE:                                                                                                                                                                                           SUBJECTIVE STATEMENT: Feeling a little better and was able to sit majority of the staff meeting yesterday (1.5 hour meeting).  Had tension the  other day for a couple of days but that went away.  PERTINENT HISTORY:  Goes by Kathryn Cohen Ongoing problem related to a fall off horse at 47 years old; resolved but lifted something heavy triggered an exacerbation; then college age back spasms; got PT and acupuncture (bad experience lots of providers, lying lifted leg with weight worsened) Recent illness lying down flared up back Lots of recent travel may have aggravated Patient unable to sit for the evaluation (standing more comfortable) 9 yo  daughter PAIN:  PAIN:  Are you having pain? No pain just tight today NPRS scale: 0/10 Pain location: feels like a pebble at the tailbone; left low back and lateral hip  Aggravating factors: sitting > 1 hour sharp pains 7/10; walking inclines will hurt  Relieving factors: swims; stretching midback; yoga every morning; usually walking ok on level ground  PRECAUTIONS: None  WEIGHT BEARING RESTRICTIONS: No  FALLS:  Has patient fallen in last 6 months? No  LIVING ENVIRONMENT: Lives with: lives with their family Lives in: House/apartment   OCCUPATION: standing desk, tries to limit sitting  PLOF: Independent  PATIENT GOALS: reduce my pain; more functional I can barely sit at all; wants to travel;  mid June airplane [place];  need to be able to walk normal speed keep up with daughter 73 year old  NEXT MD VISIT: nothing scheduled  OBJECTIVE:   DIAGNOSTIC FINDINGS:  X-rays Mild degen lower thoracic  PATIENT SURVEYS:  Modified Oswestry: 40% moderate disability 6/4: 32% 7/9: 8/50 = 16%  COGNITION: Overall cognitive status: Within functional limits for tasks assessed       MUSCLE LENGTH: Hamstrings: Right 75 deg; Left 75 deg Thomas test: Right 10 deg; Left 10 deg  POSTURE: No Significant postural limitations  PALPATION: Decreased QL mobility  LUMBAR ROM:   AROM eval 6/18  Flexion Fingertips to shoes with feet together full  Extension 20 27  Right lateral flexion 20 25  Left lateral  flexion 20 25  Right rotation    Left rotation     (Blank rows = not tested)  LOWER EXTREMITY ROM:   decreased right hip internal rotation ROM 5 degrees; left internal rotation 12 degrees  09/25/2022: Bilateral hip internal rotation is 25 degrees  TRUNK STRENGTH:  Decreased activation of transverse abdominus muscles; abdominals 4/5; decreased activation of lumbar multifidi; trunk extensors 4/5  Bird dog 4/5; modified plank 4/5; bent knee lift and lower= abs 4/5  6/18:  TRUNK STRENGTH GROSSLY 4+/5  LOWER EXTREMITY MMT:    MMT Right eval Left eval 6/11  Hip flexion 5 5 5   Hip extension 4 4 4+  Hip abduction 4 4 4+  Hip adduction     Hip internal rotation     Hip external rotation 4 4 4+  Knee flexion     Knee extension 5 5 5   Ankle dorsiflexion     Ankle plantarflexion     Ankle inversion     Ankle eversion      (Blank rows = not tested)  09/25/2022: Right hip flexion strength of 5-/5 Left hip flexion strength of 4+/5  6/18:  left hip flexion 5-/5  LUMBAR SPECIAL TESTS:  Straight leg raise test: Negative and Single leg stance test: Negative   GAIT: Comments: decreased gait speed  TODAY'S TREATMENT:     2022-12-08 Exercise and NMRE  Dead lift weights 10 and 15# kettle bell with legs against step lowering to 4" Hip flexion lunge with stretch on step 20x bil Backward walk with sports cord - 10lb Deep squat sits with hip rotation and ball rolling side to side Side bend on the peanut Lunge with band press out Archer punches with thoracic rotation  Manual therapy   Bil thoracic and lumbar paraspinals Trigger Point Dry-Needling  Treatment instructions: Expect mild to moderate muscle soreness. S/S of pneumothorax if dry needled over a lung field, and to seek immediate medical attention should they occur. Patient verbalized understanding of these instructions and education.  Patient Consent  Given: Yes Education handout provided: Yes Muscles treated: lumbar and  thoracic paraspinals Electrical stimulation performed: No Parameters: N/A Treatment response/outcome: increased tissue mobility    2022-11-27 Exercise and NMRE  Dead lift weights 10 and 15# kettle bell with legs against step lowering to 4" Leg press machine single leg - 35lb - 2x10 Deep squats holding railing - 10x with 10 count hold; 1x with leaning forward 10x Hip flexion lunge with stretch on step 20x bil  Manual therapy   Bil gluteals and lumbar paraspinals, adductors and ischial cavernosis Trigger Point Dry-Needling  Treatment instructions: Expect mild to moderate muscle soreness. S/S of pneumothorax if dry needled over a lung field, and to seek immediate medical attention should they occur. Patient verbalized understanding of these instructions and education.  Patient Consent Given: Yes Education handout provided: Previously provided Muscles treated: bil lumbar paraspinals and bil glute med Electrical stimulation performed: No Parameters: N/A Treatment response/outcome: increased soft tissue length  11/05/2022 Exercise and NMRE Single leg dead lifts with weight - 10# kettle bell Dead lift weights 10 and 15# kettle bell with legs against step  Hip machine 40lb - hip abduction 15x bil Leg press machine single leg - 30lb - 2x10 March holding 10# kettle bell Hip flexion stretch on step 20x  Manual therapy  Patient confirms identification and approves physical therapist to perform internal soft tissue work  - rectal to coccygeus bil and sacral mobs with coccyx stabilization  Bil gluteals and lumbar paraspinals Trigger Point Dry-Needling  Treatment instructions: Expect mild to moderate muscle soreness. S/S of pneumothorax if dry needled over a lung field, and to seek immediate medical attention should they occur. Patient verbalized understanding of these instructions and education.  Patient Consent Given: Yes Education handout provided: Previously provided Muscles treated:  bil lumbar paraspinals and bil glute med Electrical stimulation performed: No Parameters: N/A Treatment response/outcome: increased soft tissue length                                                                                                                        PATIENT EDUCATION:  Education details: Educated patient on anatomy and physiology of current symptoms, prognosis, plan of care as well as initial self care strategies to promote recovery Use of lumbar roll when sitting to avoid sacral sitting Person educated: Patient Education method: Explanation Education comprehension: verbalized understanding  HOME EXERCISE PROGRAM: Access Code: [email or web address] URL: [email or web address] Date: 09/25/2022 Prepared by: Clydie Braun Menke  Exercises - Supine Piriformis Stretch (Mirrored)  - 1 x daily - 7 x weekly - 1 sets - 3 reps - 20 hold - Clamshell  - 1 x daily - 7 x weekly - 1 sets - 10 reps - Hooklying Isometric Hip Flexion  - 1 x daily - 7 x weekly - 1 sets - 5 reps - Hooklying Sequential Leg March and Lower  - 1 x daily - 7 x weekly - 1 sets - 10 reps - Plank on Knees  - 1 x  daily - 7 x weekly - 1 sets - 10 reps - Quadruped Hip Extension Kicks  - 1 x daily - 7 x weekly - 2 sets - 10 reps - Quadruped Rocking Slow  - 1 x daily - 7 x weekly - 1 sets - 10 reps - Standing Hip Internal and External Rotation AROM  - 1 x daily - 7 x weekly - 1 sets - 10 reps - Half Kneeling Hip Flexor Stretch with Tri-Planar Reach  - 1 x daily - 7 x weekly - 3 sets - 10 reps - Quadruped Cat Cow  - 1 x daily - 7 x weekly - 3 sets - 10 reps - Quadruped Thoracic Spine Extension  - 1 x daily - 7 x weekly - 3 sets - 10 reps - Quadruped Thoracic Lumbar Side Bend  - 1 x daily - 7 x weekly - 3 sets - 10 reps  ASSESSMENT:  CLINICAL IMPRESSION: Pt felt sore/tight after exercises last session but not sure if it was the exercises.  Currently feeling better and was able to sit much longer at meeting.  Pt  continues to have tension along paraspinals and improves with dry needling and manual techniques.  Pt will benefit from skilled PT as she is expected to continue to make slow but steady progress with strengthening and manual techniques for increased soft tissue length   OBJECTIVE IMPAIRMENTS: decreased activity tolerance, decreased mobility, difficulty walking, decreased ROM, decreased strength, increased fascial restrictions, impaired perceived functional ability, and pain.   ACTIVITY LIMITATIONS: sitting, standing, sleeping, and locomotion level  PARTICIPATION LIMITATIONS: driving, community activity, and occupation  PERSONAL FACTORS: Past/current experiences and Time since onset of injury/illness/exacerbation are also affecting patient's functional outcome.   REHAB POTENTIAL: Good  CLINICAL DECISION MAKING: Stable/uncomplicated  EVALUATION COMPLEXITY: Low   GOALS: Goals reviewed with patient? Yes  SHORT TERM GOALS: Target date: 09/24/2022    Updated (09/18/22)   The patient will demonstrate knowledge of basic self care strategies and exercises to promote healing   Baseline: Goal status: MET  2.  The patient will report a 30% improvement in pain levels with functional activities which are currently difficult including sitting for work and traveling  (upcoming trip to [place]) Baseline:  Goal status: met 6/18 3.  The patient will have improved hip strength to at least 4+/5 needed for standing, walking inclines, walking longer distances  Baseline:  Goal status: MET on 09/25/2022  4. Improved right hip internal rotation ROM to 15 degrees needed for improved  Baseline:  Goal status: MET on 09/25/2022   LONG TERM GOALS: Target date: 12/10/2022  Updated 11/05/22   The patient will be independent in a safe self progression of a home exercise program to promote further recovery of function  Baseline:  Goal status: ongoing 2.  The patient will report a 70% improvement in pain levels  with functional activities which are currently difficult including sitting Baseline:   60% Goal status: ongoing  3.  The patient will have improved trunk flexor and extensor muscle strength to at least 4+/5 needed for lifting medium weight objects such as grocery bags, laundry and luggage  Baseline:  Goal status: ongoing  4.  Improved sitting tolerance for flight to [place] and gait stamina for walking moderate community distances for travel Baseline: able to travel to [place] and did well with that Goal status: MET  5.  Modified Oswestry score improved to 30% indicating improved function with less pain Baseline: 16% Goal status:MET  PLAN:  PT FREQUENCY: 2x/week  PT DURATION: 8 weeks  PLANNED INTERVENTIONS: Therapeutic exercises, Therapeutic activity, Neuromuscular re-education, Patient/Family education, Self Care, Joint mobilization, Aquatic Therapy, Dry Needling, Spinal mobilization, Cryotherapy, Moist heat, Taping, Traction, Ionotophoresis 4mg /ml Dexamethasone, Manual therapy, and Re-evaluation.  PLAN FOR NEXT SESSION: squats and dead lifts;   Pallof series to HEP red band stir the pot and ABCs;  DN  as needed; pelvic floor soft tissue work if needed    Russella Dar, PT, DPT 11/22/22 10:25 AM   [facility] [address], [address] [place], [place] [address] Phone # [phone] Fax [phone]

## 2022-11-27 ENCOUNTER — Other Ambulatory Visit (HOSPITAL_COMMUNITY): Payer: Self-pay

## 2022-11-27 MED ORDER — MONTELUKAST SODIUM 10 MG PO TABS
10.0000 mg | ORAL_TABLET | Freq: Every evening | ORAL | 1 refills | Status: AC
  Filled 2022-11-27: qty 90, 90d supply, fill #0
  Filled 2023-03-07: qty 90, 90d supply, fill #1

## 2022-11-28 ENCOUNTER — Ambulatory Visit: Payer: 59 | Attending: Internal Medicine | Admitting: Physical Therapy

## 2022-11-28 DIAGNOSIS — M5459 Other low back pain: Secondary | ICD-10-CM

## 2022-11-28 DIAGNOSIS — R531 Weakness: Secondary | ICD-10-CM

## 2022-11-28 NOTE — Therapy (Signed)
OUTPATIENT PHYSICAL THERAPY TREATMENT NOTE   Patient Name: Kathryn Cohen MRN: [id number] DOB:04/08/1976, 47 y.o., female Today's Date: 08/27/22  END OF SESSION:  PT End of Session - 11/28/22 0933     Visit Number 18    Date for PT Re-Evaluation 12/10/22    Authorization Type [facility] [employer]    PT Start Time [phone]    PT Stop Time 1014    PT Time Calculation (min) 43 min    Activity Tolerance Patient tolerated treatment well    Behavior During Therapy WFL for tasks assessed/performed                      Past Medical History:  Diagnosis Date   Allergy    Asthma    Back pain    Breast hypertrophy    Laceration of hand    rt long, ring and small fingers   Neck pain    Past Surgical History:  Procedure Laterality Date   BREAST REDUCTION SURGERY Bilateral 03/14/2017   Procedure: BILATERAL MAMMARY REDUCTION  (BREAST);  Surgeon: Glenna Fellows, MD;  Location: [facility];  Service: Plastics;  Laterality: Bilateral;   NERVE, TENDON AND ARTERY REPAIR Right 02/02/2015   Procedure: RIGHT LONG RING SMALL FINGER REPAIR NERVE, TENDON AND ARTERY REPAIR;  Surgeon: Betha Loa, MD;  Location: [facility];  Service: Orthopedics;  Laterality: Right;   REDUCTION MAMMAPLASTY Bilateral 02/2017   Patient Active Problem List   Diagnosis Date Noted   Asthma 02/24/2011   Allergic rhinitis 02/24/2011    PCP: Polite,Ronald MD  REFERRING PROVIDER: Polite,Ronald MD  REFERRING DIAG: low back pain  Rationale for Evaluation and Treatment: Rehabilitation  THERAPY DIAG:  Back pain; weakness  ONSET DATE: 04/29/2022  SUBJECTIVE:                                                                                                                                                                                           SUBJECTIVE STATEMENT: Feeling a little better each time, will see how it goes on the trip to the beach.  I had some soreness after the dry  needling last time but it went away after 1-2 days.  PERTINENT HISTORY:  Goes by Kathryn Cohen Ongoing problem related to a fall off horse at 47 years old; resolved but lifted something heavy triggered an exacerbation; then college age back spasms; got PT and acupuncture (bad experience lots of providers, lying lifted leg with weight worsened) Recent illness lying down flared up back Lots of recent travel may have aggravated Patient unable to sit for the evaluation (standing more  comfortable) 19 yo daughter PAIN:  PAIN:  Are you having pain? No pain just tight today NPRS scale: 0/10 Pain location: feels like a pebble at the tailbone; left low back and lateral hip  Aggravating factors: sitting > 1 hour sharp pains 7/10; walking inclines will hurt  Relieving factors: swims; stretching midback; yoga every morning; usually walking ok on level ground  PRECAUTIONS: None  WEIGHT BEARING RESTRICTIONS: No  FALLS:  Has patient fallen in last 6 months? No  LIVING ENVIRONMENT: Lives with: lives with their family Lives in: House/apartment   OCCUPATION: standing desk, tries to limit sitting  PLOF: Independent  PATIENT GOALS: reduce my pain; more functional I can barely sit at all; wants to travel;  mid June airplane [place];  need to be able to walk normal speed keep up with daughter 54 year old  NEXT MD VISIT: nothing scheduled  OBJECTIVE:   DIAGNOSTIC FINDINGS:  X-rays Mild degen lower thoracic  PATIENT SURVEYS:  Modified Oswestry: 40% moderate disability 6/4: 32% 7/9: 8/50 = 16%  COGNITION: Overall cognitive status: Within functional limits for tasks assessed       MUSCLE LENGTH: Hamstrings: Right 75 deg; Left 75 deg Thomas test: Right 10 deg; Left 10 deg  POSTURE: No Significant postural limitations  PALPATION: Decreased QL mobility  LUMBAR ROM:   AROM eval 6/18  Flexion Fingertips to shoes with feet together full  Extension 20 27  Right lateral flexion 20 25  Left lateral  flexion 20 25  Right rotation    Left rotation     (Blank rows = not tested)  LOWER EXTREMITY ROM:   decreased right hip internal rotation ROM 5 degrees; left internal rotation 12 degrees  09/25/2022: Bilateral hip internal rotation is 25 degrees  TRUNK STRENGTH:  Decreased activation of transverse abdominus muscles; abdominals 4/5; decreased activation of lumbar multifidi; trunk extensors 4/5  Bird dog 4/5; modified plank 4/5; bent knee lift and lower= abs 4/5  6/18:  TRUNK STRENGTH GROSSLY 4+/5  LOWER EXTREMITY MMT:    MMT Right eval Left eval 6/11  Hip flexion 5 5 5   Hip extension 4 4 4+  Hip abduction 4 4 4+  Hip adduction     Hip internal rotation     Hip external rotation 4 4 4+  Knee flexion     Knee extension 5 5 5   Ankle dorsiflexion     Ankle plantarflexion     Ankle inversion     Ankle eversion      (Blank rows = not tested)  09/25/2022: Right hip flexion strength of 5-/5 Left hip flexion strength of 4+/5  6/18:  left hip flexion 5-/5  LUMBAR SPECIAL TESTS:  Straight leg raise test: Negative and Single leg stance test: Negative   GAIT: Comments: decreased gait speed  TODAY'S TREATMENT:     12/19/2022 Exercise and NMRE  Dead lift weights 10 and 15# kettle bell with legs against step lowering to 4" Hip flexion lunge with stretch on step 20x bil Backward walk with sports cord - 15lb Side step sports cord - 10lb Side step on half foam Standing on half foam with some support from other foot on the floor - lift 4lb weight Archer punches with thoracic rotation - 5lb pulley Single leg dead lift reaching 3 ways placing cone down  Manual therapy  Bil thoracic and lumbar paraspinals Trigger Point Dry-Needling  Treatment instructions: Expect mild to moderate muscle soreness. S/S of pneumothorax if dry needled over a lung  field, and to seek immediate medical attention should they occur. Patient verbalized understanding of these instructions and  education.  Patient Consent Given: Yes Education handout provided: Previously provided Muscles treated: lumbar multifidi Electrical stimulation performed: No Parameters: N/A Treatment response/outcome: increased soft tissue length and twitch response   12/16/22 Exercise and NMRE  Dead lift weights 10 and 15# kettle bell with legs against step lowering to 4" Hip flexion lunge with stretch on step 20x bil Backward walk with sports cord - 10lb Deep squat sits with hip rotation and ball rolling side to side Side bend on the peanut Lunge with band press out Archer punches with thoracic rotation   Manual therapy   Bil thoracic and lumbar paraspinals Trigger Point Dry-Needling  Treatment instructions: Expect mild to moderate muscle soreness. S/S of pneumothorax if dry needled over a lung field, and to seek immediate medical attention should they occur. Patient verbalized understanding of these instructions and education.  Patient Consent Given: Yes Education handout provided: Yes Muscles treated: lumbar and thoracic paraspinals Electrical stimulation performed: No Parameters: N/A Treatment response/outcome: increased tissue mobility    December 06, 2022 Exercise and NMRE  Dead lift weights 10 and 15# kettle bell with legs against step lowering to 4" Leg press machine single leg - 35lb - 2x10 Deep squats holding railing - 10x with 10 count hold; 1x with leaning forward 10x Hip flexion lunge with stretch on step 20x bil  Manual therapy   Bil gluteals and lumbar paraspinals, adductors and ischial cavernosis Trigger Point Dry-Needling  Treatment instructions: Expect mild to moderate muscle soreness. S/S of pneumothorax if dry needled over a lung field, and to seek immediate medical attention should they occur. Patient verbalized understanding of these instructions and education.  Patient Consent Given: Yes Education handout provided: Previously provided Muscles treated: bil lumbar  paraspinals and bil glute med Electrical stimulation performed: No Parameters: N/A Treatment response/outcome: increased soft tissue length  11/05/2022 Exercise and NMRE Single leg dead lifts with weight - 10# kettle bell Dead lift weights 10 and 15# kettle bell with legs against step  Hip machine 40lb - hip abduction 15x bil Leg press machine single leg - 30lb - 2x10 March holding 10# kettle bell Hip flexion stretch on step 20x  Manual therapy  Patient confirms identification and approves physical therapist to perform internal soft tissue work  - rectal to coccygeus bil and sacral mobs with coccyx stabilization  Bil gluteals and lumbar paraspinals Trigger Point Dry-Needling  Treatment instructions: Expect mild to moderate muscle soreness. S/S of pneumothorax if dry needled over a lung field, and to seek immediate medical attention should they occur. Patient verbalized understanding of these instructions and education.  Patient Consent Given: Yes Education handout provided: Previously provided Muscles treated: bil lumbar paraspinals and bil glute med Electrical stimulation performed: No Parameters: N/A Treatment response/outcome: increased soft tissue length  PATIENT EDUCATION:  Education details: Educated patient on anatomy and physiology of current symptoms, prognosis, plan of care as well as initial self care strategies to promote recovery Use of lumbar roll when sitting to avoid sacral sitting Person educated: Patient Education method: Explanation Education comprehension: verbalized understanding  HOME EXERCISE PROGRAM: Access Code: [email or web address] URL: [email or web address] Date: 09/25/2022 Prepared by: Clydie Braun Menke  Exercises - Supine Piriformis Stretch (Mirrored)  - 1 x daily - 7 x weekly - 1 sets - 3 reps - 20 hold - Clamshell  - 1 x daily -  7 x weekly - 1 sets - 10 reps - Hooklying Isometric Hip Flexion  - 1 x daily - 7 x weekly - 1 sets - 5 reps - Hooklying Sequential Leg March and Lower  - 1 x daily - 7 x weekly - 1 sets - 10 reps - Plank on Knees  - 1 x daily - 7 x weekly - 1 sets - 10 reps - Quadruped Hip Extension Kicks  - 1 x daily - 7 x weekly - 2 sets - 10 reps - Quadruped Rocking Slow  - 1 x daily - 7 x weekly - 1 sets - 10 reps - Standing Hip Internal and External Rotation AROM  - 1 x daily - 7 x weekly - 1 sets - 10 reps - Half Kneeling Hip Flexor Stretch with Tri-Planar Reach  - 1 x daily - 7 x weekly - 3 sets - 10 reps - Quadruped Cat Cow  - 1 x daily - 7 x weekly - 3 sets - 10 reps - Quadruped Thoracic Spine Extension  - 1 x daily - 7 x weekly - 3 sets - 10 reps - Quadruped Thoracic Lumbar Side Bend  - 1 x daily - 7 x weekly - 3 sets - 10 reps  ASSESSMENT:  CLINICAL IMPRESSION: Pt has met several long term goals.  She is going to be driving to the beach in two days and will see how she feels but reports things have gradually been feeling better each time.  Pt was able to add more functional exercises and added single leg reaches in various positions today.  Pt felt fatigued but no increased pain with exercise.  Pt will benefit from skilled PT to continue functional core and hip strengthening to manage pain in back and tailbone.   OBJECTIVE IMPAIRMENTS: decreased activity tolerance, decreased mobility, difficulty walking, decreased ROM, decreased strength, increased fascial restrictions, impaired perceived functional ability, and pain.   ACTIVITY LIMITATIONS: sitting, standing, sleeping, and locomotion level  PARTICIPATION LIMITATIONS: driving, community activity, and occupation  PERSONAL FACTORS: Past/current experiences and Time since onset of injury/illness/exacerbation are also affecting patient's functional outcome.   REHAB POTENTIAL: Good  CLINICAL DECISION MAKING: Stable/uncomplicated  EVALUATION  COMPLEXITY: Low   GOALS: Goals reviewed with patient? Yes  SHORT TERM GOALS: Target date: 09/24/2022    Updated (09/18/22)   The patient will demonstrate knowledge of basic self care strategies and exercises to promote healing   Baseline: Goal status: MET  2.  The patient will report a 30% improvement in pain levels with functional activities which are currently difficult including sitting for work and traveling  (upcoming trip to [place]) Baseline:  Goal status: met 6/18 3.  The patient will have improved hip strength to at least 4+/5 needed for standing, walking inclines, walking longer distances  Baseline:  Goal status: MET on 09/25/2022  4. Improved right hip internal rotation ROM to 15 degrees needed for improved  Baseline:  Goal status: MET on 09/25/2022   LONG TERM GOALS: Target date: 12/10/2022  Updated 11/28/22   The patient will be independent in a safe self progression of a home exercise program to promote further recovery of function  Baseline:  Goal status: ongoing 2.  The patient will report a 70% improvement in pain levels with functional activities which are currently difficult including sitting Baseline:   60% Goal status: ongoing  3.  The patient will have improved trunk flexor and extensor muscle strength to at least 4+/5 needed for lifting medium weight objects such as grocery bags, laundry and luggage  Baseline: if it is more than around 10 lbs then I feel the discomfort or sore in the low back or tailbone Goal status: ongoing  4.  Improved sitting tolerance for flight to [place] and gait stamina for walking moderate community distances for travel Baseline: able to travel to [place] and did well with that Goal status: MET  5.  Modified Oswestry score improved to 30% indicating improved function with less pain Baseline: 16% Goal status:MET    PLAN:  PT FREQUENCY: 2x/week  PT DURATION: 8 weeks  PLANNED INTERVENTIONS: Therapeutic exercises, Therapeutic  activity, Neuromuscular re-education, Patient/Family education, Self Care, Joint mobilization, Aquatic Therapy, Dry Needling, Spinal mobilization, Cryotherapy, Moist heat, Taping, Traction, Ionotophoresis 4mg /ml Dexamethasone, Manual therapy, and Re-evaluation.  PLAN FOR NEXT SESSION: Re-eval and f/u on pain with road trip to the [facility], PT, DPT 11/28/22 9:34 AM   [facility] [address], [address] [place], [place] [address] Phone # [phone] Fax [phone]

## 2022-12-09 ENCOUNTER — Other Ambulatory Visit (HOSPITAL_COMMUNITY): Payer: Self-pay

## 2022-12-10 ENCOUNTER — Ambulatory Visit: Payer: 59 | Admitting: Physical Therapy

## 2022-12-10 DIAGNOSIS — M5459 Other low back pain: Secondary | ICD-10-CM | POA: Diagnosis not present

## 2022-12-10 DIAGNOSIS — R531 Weakness: Secondary | ICD-10-CM

## 2022-12-10 NOTE — Therapy (Signed)
OUTPATIENT PHYSICAL THERAPY TREATMENT NOTE/RECERTIFICATION   Patient Name: Kathryn Cohen MRN: [id number] DOB:1976-02-10, 47 y.o., female Today's Date: 08/27/22  END OF SESSION:  PT End of Session - 12/10/22 1041     Visit Number 19    Date for PT Re-Evaluation 03/04/23    Authorization Type [facility] [employer]    PT Start Time 0845    PT Stop Time 0930    PT Time Calculation (min) 45 min    Activity Tolerance Patient tolerated treatment well                      Past Medical History:  Diagnosis Date   Allergy    Asthma    Back pain    Breast hypertrophy    Laceration of hand    rt long, ring and small fingers   Neck pain    Past Surgical History:  Procedure Laterality Date   BREAST REDUCTION SURGERY Bilateral 03/14/2017   Procedure: BILATERAL MAMMARY REDUCTION  (BREAST);  Surgeon: Glenna Fellows, MD;  Location: [facility];  Service: Plastics;  Laterality: Bilateral;   NERVE, TENDON AND ARTERY REPAIR Right 02/02/2015   Procedure: RIGHT LONG RING SMALL FINGER REPAIR NERVE, TENDON AND ARTERY REPAIR;  Surgeon: Betha Loa, MD;  Location: [facility];  Service: Orthopedics;  Laterality: Right;   REDUCTION MAMMAPLASTY Bilateral 02/2017   Patient Active Problem List   Diagnosis Date Noted   Asthma 02/24/2011   Allergic rhinitis 02/24/2011    PCP: Polite,Ronald MD  REFERRING PROVIDER: Polite,Ronald MD  REFERRING DIAG: low back pain  Rationale for Evaluation and Treatment: Rehabilitation  THERAPY DIAG:  Back pain; weakness  ONSET DATE: 04/29/2022  SUBJECTIVE:                                                                                                                                                                                           SUBJECTIVE STATEMENT: Hard to sit for car ride to and from the beach.  The DN and ex's have been really helpful.  Walking an incline is better.  I did walk at the beach.  I did the 10#  cat litter a little better.  I can do planks now, that helped with boogie boarding at the beach. PERTINENT HISTORY:  Goes by [name] Ongoing problem related to a fall off horse at 47 years old; resolved but lifted something heavy triggered an exacerbation; then college age back spasms; got PT and acupuncture (bad experience lots of providers, lying lifted leg with weight worsened) Recent illness lying down flared up back Lots of recent travel may  have aggravated Patient unable to sit for the evaluation (standing more comfortable) 50 yo daughter PAIN:  PAIN:  Are you having pain? Yes NPRS scale: 3/10 Pain location: feels like a pebble at the tailbone; left low back and lateral hip  Aggravating factors: sitting > 1 hour sharp pains 7/10; walking inclines will hurt  Relieving factors: swims; stretching midback; yoga every morning; usually walking ok on level ground  PRECAUTIONS: None  WEIGHT BEARING RESTRICTIONS: No  FALLS:  Has patient fallen in last 6 months? No  LIVING ENVIRONMENT: Lives with: lives with their family Lives in: House/apartment   OCCUPATION: standing desk, tries to limit sitting  PLOF: Independent  PATIENT GOALS: reduce my pain; more functional I can barely sit at all; wants to travel;  mid June airplane [place];  need to be able to walk normal speed keep up with daughter 19 year old  NEXT MD VISIT: nothing scheduled  OBJECTIVE:   DIAGNOSTIC FINDINGS:  X-rays Mild degen lower thoracic  PATIENT SURVEYS:  Modified Oswestry: 40% moderate disability 6/4: 32% 7/9: 8/50 = 16% goal met  COGNITION: Overall cognitive status: Within functional limits for tasks assessed       MUSCLE LENGTH: Hamstrings: Right 75 deg; Left 75 deg Thomas test: Right 10 deg; Left 10 deg  POSTURE: No Significant postural limitations  PALPATION: Decreased QL mobility  LUMBAR ROM:   AROM eval 6/18 8/13  Flexion Fingertips to shoes with feet together full Full 72  Extension 20 27  34  Right lateral flexion 20 25 45  Left lateral flexion 20 25 45  Right rotation     Left rotation      (Blank rows = not tested)  LOWER EXTREMITY ROM:   decreased right hip internal rotation ROM 5 degrees; left internal rotation 12 degrees  09/25/2022: Bilateral hip internal rotation is 25 degrees  TRUNK STRENGTH:  Decreased activation of transverse abdominus muscles; abdominals 4/5; decreased activation of lumbar multifidi; trunk extensors 4/5  Bird dog 4/5; modified plank 4/5; bent knee lift and lower= abs 4/5  6/18:  TRUNK STRENGTH GROSSLY 4+/5 8/13: TRUNK STRENGTH:  Decreased activation of transverse abdominus muscles; abdominals 4+/5; decreased activation of lumbar multifidi; trunk extensors 4+/5   LOWER EXTREMITY MMT:    MMT Right eval Left eval 6/11 8/13  Hip flexion 5 5 5 5   Hip extension 4 4 4+ 4+  Hip abduction 4 4 4+ 4+  Hip adduction      Hip internal rotation      Hip external rotation 4 4 4+ 4+  Knee flexion      Knee extension 5 5 5 5   Ankle dorsiflexion      Ankle plantarflexion      Ankle inversion      Ankle eversion       (Blank rows = not tested)  09/25/2022: Right hip flexion strength of 5-/5 Left hip flexion strength of 4+/5  6/18:  left hip flexion 5-/5 8/13:  left hip flexion 5-/5  LUMBAR SPECIAL TESTS:  Straight leg raise test: Negative and Single leg stance test: Negative   GAIT: Comments: decreased gait speed The Patient-Specific Functional Scale  Initial:  I am going to ask you to identify up to 3 important activities that you are unable to do or are having difficulty with as a result of this problem.  Today are there any activities that you are unable to do or having difficulty with because of this?  (Patient shown scale and patient  rated each activity)  Follow up: When you first came in you had difficulty performing these activities.  Today do you still have difficulty?  Patient-Specific activity scoring scheme (Point to one  number):  0 1 2 3 4 5 6 7 8 9  10 Unable                                                                                                          Able to perform To perform                                                                                                    activity at the same Activity         Level as before                                                                                                                       Injury or problem  Activity        Picking up bulky items/small box from the floor                                  8/13:       2                2.         Sitting for work/staff meeting                                                                8/13:    1                    3.          Walking at a more normal or faster speed  2 miles  8/13:          4                 TODAY'S TREATMENT:     12/10/2022 Lumbar ROM SLS Hip MMT Lumbar/trunk strength testing Bird dogs Review of HEP Manual therapy Bil lumbar paraspinals; left QL, left piriformis Trigger Point Dry-Needling  Treatment instructions: Expect mild to moderate muscle soreness. S/S of pneumothorax if dry needled over a lung field, and to seek immediate medical attention should they occur. Patient verbalized understanding of these instructions and education.  Patient Consent Given: Yes Education handout provided: Previously provided Muscles treated: lumbar multifidi bil; sidelying proximal piriformis left; QL left Electrical stimulation performed: No Parameters: N/A Treatment response/outcome: increased soft tissue length and twitch response 12-21-2022 Exercise and NMRE  Dead lift weights 10 and 15# kettle bell with legs against step lowering to 4" Hip flexion lunge with stretch on step 20x bil Backward walk with sports cord - 15lb Side step sports cord - 10lb Side step on half foam Standing on half foam with some support from other foot on the floor - lift  4lb weight Archer punches with thoracic rotation - 5lb pulley Single leg dead lift reaching 3 ways placing cone down  Manual therapy  Bil thoracic and lumbar paraspinals Trigger Point Dry-Needling  Treatment instructions: Expect mild to moderate muscle soreness. S/S of pneumothorax if dry needled over a lung field, and to seek immediate medical attention should they occur. Patient verbalized understanding of these instructions and education.  Patient Consent Given: Yes Education handout provided: Previously provided Muscles treated: lumbar multifidi Electrical stimulation performed: No Parameters: N/A Treatment response/outcome: increased soft tissue length and twitch response   Dec 15, 2022 Exercise and NMRE  Dead lift weights 10 and 15# kettle bell with legs against step lowering to 4" Hip flexion lunge with stretch on step 20x bil Backward walk with sports cord - 10lb Deep squat sits with hip rotation and ball rolling side to side Side bend on the peanut Lunge with band press out Archer punches with thoracic rotation   Manual therapy   Bil thoracic and lumbar paraspinals Trigger Point Dry-Needling  Treatment instructions: Expect mild to moderate muscle soreness. S/S of pneumothorax if dry needled over a lung field, and to seek immediate medical attention should they occur. Patient verbalized understanding of these instructions and education.  Patient Consent Given: Yes Education handout provided: Yes Muscles treated: lumbar and thoracic paraspinals Electrical stimulation performed: No Parameters: N/A Treatment response/outcome: increased tissue mobility    December 05, 2022 Exercise and NMRE  Dead lift weights 10 and 15# kettle bell with legs against step lowering to 4" Leg press machine single leg - 35lb - 2x10 Deep squats holding railing - 10x with 10 count hold; 1x with leaning forward 10x Hip flexion lunge with stretch on step 20x bil  Manual therapy   Bil gluteals  and lumbar paraspinals, adductors and ischial cavernosis Trigger Point Dry-Needling  Treatment instructions: Expect mild to moderate muscle soreness. S/S of pneumothorax if dry needled over a lung field, and to seek immediate medical attention should they occur. Patient verbalized understanding of these instructions and education.  Patient Consent Given: Yes Education handout provided: Previously provided Muscles treated: bil lumbar paraspinals and bil glute med Electrical stimulation performed: No Parameters: N/A Treatment response/outcome: increased soft tissue length  PATIENT EDUCATION:  Education details: Educated patient on anatomy and physiology of current symptoms, prognosis, plan of care as well as initial self care strategies to promote recovery Use of lumbar roll when sitting to avoid sacral sitting Person educated: Patient Education method: Explanation Education comprehension: verbalized understanding  HOME EXERCISE PROGRAM: Access Code: [email or web address] URL: [email or web address] Date: 09/25/2022 Prepared by: Clydie Braun Menke  Exercises - Supine Piriformis Stretch (Mirrored)  - 1 x daily - 7 x weekly - 1 sets - 3 reps - 20 hold - Clamshell  - 1 x daily - 7 x weekly - 1 sets - 10 reps - Hooklying Isometric Hip Flexion  - 1 x daily - 7 x weekly - 1 sets - 5 reps - Hooklying Sequential Leg March and Lower  - 1 x daily - 7 x weekly - 1 sets - 10 reps - Plank on Knees  - 1 x daily - 7 x weekly - 1 sets - 10 reps - Quadruped Hip Extension Kicks  - 1 x daily - 7 x weekly - 2 sets - 10 reps - Quadruped Rocking Slow  - 1 x daily - 7 x weekly - 1 sets - 10 reps - Standing Hip Internal and External Rotation AROM  - 1 x daily - 7 x weekly - 1 sets - 10 reps - Half Kneeling Hip Flexor Stretch with Tri-Planar Reach  - 1 x daily - 7 x weekly - 3 sets - 10 reps -  Quadruped Cat Cow  - 1 x daily - 7 x weekly - 3 sets - 10 reps - Quadruped Thoracic Spine Extension  - 1 x daily - 7 x weekly - 3 sets - 10 reps - Quadruped Thoracic Lumbar Side Bend  - 1 x daily - 7 x weekly - 3 sets - 10 reps  ASSESSMENT:  CLINICAL IMPRESSION: The patient rates her overall improvement at 65% since start of care although she had an exacerbation of symptoms after a long car ride back from the beach.  She is also concerned about moving boxes with home renovations.  Strength and movement impairments in lumbo/pelvic/hip musculature affect ability to lift heavier items, walk at a faster speed and sit for work and meetings.  The patient would benefit from a continuation of skilled PT for a further progression of strengthening and functional mobility.  Will continue to update and promote independence in a HEP needed for a return to the highest functional level possible with ADLs.     OBJECTIVE IMPAIRMENTS: decreased activity tolerance, decreased mobility, difficulty walking, decreased ROM, decreased strength, increased fascial restrictions, impaired perceived functional ability, and pain.   ACTIVITY LIMITATIONS: sitting, standing, sleeping, and locomotion level  PARTICIPATION LIMITATIONS: driving, community activity, and occupation  PERSONAL FACTORS: Past/current experiences and Time since onset of injury/illness/exacerbation are also affecting patient's functional outcome.   REHAB POTENTIAL: Good  CLINICAL DECISION MAKING: Stable/uncomplicated  EVALUATION COMPLEXITY: Low   GOALS: Goals reviewed with patient? Yes  SHORT TERM GOALS: Target date: 09/24/2022    Updated (09/18/22)   The patient will demonstrate knowledge of basic self care strategies and exercises to promote healing   Baseline: Goal status: MET  2.  The patient will report a 30% improvement in pain levels with functional activities which are currently difficult including sitting for work and traveling   (upcoming trip to [place]) Baseline:  Goal status: met 6/18 3.  The patient will have improved hip strength to at least 4+/5 needed for standing, walking inclines, walking  longer distances  Baseline:  Goal status: MET on 09/25/2022  4. Improved right hip internal rotation ROM to 15 degrees needed for improved  Baseline:  Goal status: MET on 09/25/2022   LONG TERM GOALS: Target date: 03/04/2023   The patient will be independent in a safe self progression of a home exercise program to promote further recovery of function  Baseline:  Goal status: ongoing 2.  The patient will report a 70% improvement in pain levels with functional activities which are currently difficult including sitting, lifting Baseline:   8/13 65% Goal status: ongoing  3.  The patient will have improved trunk flexor and extensor muscle strength to at least 5-/5 needed for lifting medium to heavier weight objects such as moving boxes, laundry and luggage  Baseline: if it is more than around 10 lbs then I feel the discomfort or sore in the low back or tailbone Goal status: ongoing  4.  Improved sitting tolerance for flight to [place] and gait stamina for walking moderate community distances for travel Baseline: able to travel to [place] and did well with that Goal status: MET  5.  Modified Oswestry score improved to 30% indicating improved function with less pain Baseline: 16% Goal status:MET  6.  Patient will report ability to lift/carry moving boxes improved to 4/10 on Patient functional specific scale NEW  7.  Patient will be to sit for work and meetings with a rating of 4/10 on patient functional specific scale NEW  8. Patient will be able to do a 2 mile walk with normal to faster gait speed with a rating of 6/10 NEW     PLAN:  PT FREQUENCY: 1x/week  PT DURATION: 12 weeks  PLANNED INTERVENTIONS: Therapeutic exercises, Therapeutic activity, Neuromuscular re-education, Patient/Family education, Self Care, Joint  mobilization, Aquatic Therapy, Dry Needling, Spinal mobilization, Cryotherapy, Moist heat, Taping, Traction, Ionotophoresis 4mg /ml Dexamethasone, Manual therapy, and Re-evaluation.  PLAN FOR NEXT SESSION:strengthen left QL, left glute medius, core/trunk strengthening; DN as needed   Lavinia Sharps, PT 12/10/22 10:41 AM Phone: [phone] Fax: [phone]   [facility] [address] [place], [place] [address] Phone # [phone] Fax [phone]

## 2022-12-16 ENCOUNTER — Encounter: Payer: Self-pay | Admitting: Rehabilitative and Restorative Service Providers"

## 2022-12-16 ENCOUNTER — Ambulatory Visit: Payer: 59 | Admitting: Rehabilitative and Restorative Service Providers"

## 2022-12-16 DIAGNOSIS — R531 Weakness: Secondary | ICD-10-CM | POA: Diagnosis not present

## 2022-12-16 DIAGNOSIS — M5459 Other low back pain: Secondary | ICD-10-CM

## 2022-12-16 NOTE — Therapy (Signed)
OUTPATIENT PHYSICAL THERAPY TREATMENT NOTE   Patient Name: Kathryn Cohen MRN: [id number] DOB:10/01/1975, 47 y.o., female Today's Date: 08/27/22  END OF SESSION:  PT End of Session - 12/16/22 0849     Visit Number 20    Date for PT Re-Evaluation 03/04/23    Authorization Type [facility] [employer]    PT Start Time 0848    PT Stop Time 0928    PT Time Calculation (min) 40 min    Activity Tolerance Patient tolerated treatment well    Behavior During Therapy [facility] for tasks assessed/performed                      Past Medical History:  Diagnosis Date   Allergy    Asthma    Back pain    Breast hypertrophy    Laceration of hand    rt long, ring and small fingers   Neck pain    Past Surgical History:  Procedure Laterality Date   BREAST REDUCTION SURGERY Bilateral 03/14/2017   Procedure: BILATERAL MAMMARY REDUCTION  (BREAST);  Surgeon: Glenna Fellows, MD;  Location: [facility];  Service: Plastics;  Laterality: Bilateral;   NERVE, TENDON AND ARTERY REPAIR Right 02/02/2015   Procedure: RIGHT LONG RING SMALL FINGER REPAIR NERVE, TENDON AND ARTERY REPAIR;  Surgeon: Betha Loa, MD;  Location: [facility];  Service: Orthopedics;  Laterality: Right;   REDUCTION MAMMAPLASTY Bilateral 02/2017   Patient Active Problem List   Diagnosis Date Noted   Asthma 02/24/2011   Allergic rhinitis 02/24/2011    PCP: Polite,Ronald MD  REFERRING PROVIDER: Polite,Ronald MD  REFERRING DIAG: low back pain  Rationale for Evaluation and Treatment: Rehabilitation  THERAPY DIAG:  Back pain; weakness  ONSET DATE: 04/29/2022  SUBJECTIVE:                                                                                                                                                                                           SUBJECTIVE STATEMENT: Pt reports that they are moving to [place] in November, so they were working on some packing and she had a bit of  pain.  PERTINENT HISTORY:  Goes by [name] Ongoing problem related to a fall off horse at 47 years old; resolved but lifted something heavy triggered an exacerbation; then college age back spasms; got PT and acupuncture (bad experience lots of providers, lying lifted leg with weight worsened) Recent illness lying down flared up back Lots of recent travel may have aggravated Patient unable to sit for the evaluation (standing more comfortable) 85 yo daughter PAIN:  PAIN:  Are you having  pain? Yes NPRS scale: 2/10 Pain location: feels like a pebble at the tailbone; left low back and lateral hip  Aggravating factors: sitting > 1 hour sharp pains 7/10; walking inclines will hurt  Relieving factors: swims; stretching midback; yoga every morning; usually walking ok on level ground  PRECAUTIONS: None  WEIGHT BEARING RESTRICTIONS: No  FALLS:  Has patient fallen in last 6 months? No  LIVING ENVIRONMENT: Lives with: lives with their family Lives in: House/apartment   OCCUPATION: standing desk, tries to limit sitting  PLOF: Independent  PATIENT GOALS: reduce my pain; more functional I can barely sit at all; wants to travel;  mid June airplane [place];  need to be able to walk normal speed keep up with daughter 67 year old  NEXT MD VISIT: nothing scheduled  OBJECTIVE:   DIAGNOSTIC FINDINGS:  X-rays Mild degen lower thoracic  PATIENT SURVEYS:  Modified Oswestry: 40% moderate disability 6/4: 32% 7/9: 8/50 = 16% goal met  COGNITION: Overall cognitive status: Within functional limits for tasks assessed       MUSCLE LENGTH: Hamstrings: Right 75 deg; Left 75 deg Thomas test: Right 10 deg; Left 10 deg  POSTURE: No Significant postural limitations  PALPATION: Decreased QL mobility  LUMBAR ROM:   AROM eval 6/18 8/13  Flexion Fingertips to shoes with feet together full Full 72  Extension 20 27 34  Right lateral flexion 20 25 45  Left lateral flexion 20 25 45  Right rotation      Left rotation      (Blank rows = not tested)  LOWER EXTREMITY ROM:   decreased right hip internal rotation ROM 5 degrees; left internal rotation 12 degrees  09/25/2022: Bilateral hip internal rotation is 25 degrees  TRUNK STRENGTH:  Decreased activation of transverse abdominus muscles; abdominals 4/5; decreased activation of lumbar multifidi; trunk extensors 4/5  Bird dog 4/5; modified plank 4/5; bent knee lift and lower= abs 4/5  6/18:  TRUNK STRENGTH GROSSLY 4+/5 8/13: TRUNK STRENGTH:  Decreased activation of transverse abdominus muscles; abdominals 4+/5; decreased activation of lumbar multifidi; trunk extensors 4+/5   LOWER EXTREMITY MMT:    MMT Right eval Left eval 6/11 8/13  Hip flexion 5 5 5 5   Hip extension 4 4 4+ 4+  Hip abduction 4 4 4+ 4+  Hip adduction      Hip internal rotation      Hip external rotation 4 4 4+ 4+  Knee flexion      Knee extension 5 5 5 5   Ankle dorsiflexion      Ankle plantarflexion      Ankle inversion      Ankle eversion       (Blank rows = not tested)  09/25/2022: Right hip flexion strength of 5-/5 Left hip flexion strength of 4+/5  6/18:  left hip flexion 5-/5 8/13:  left hip flexion 5-/5  LUMBAR SPECIAL TESTS:  Straight leg raise test: Negative and Single leg stance test: Negative   GAIT: Comments: decreased gait speed 12/16/2022: 6 MINUTE WALK:  1,366 ft with pt reporting "it just feels irritated" after the walk  The Patient-Specific Functional Scale  Initial:  I am going to ask you to identify up to 3 important activities that you are unable to do or are having difficulty with as a result of this problem.  Today are there any activities that you are unable to do or having difficulty with because of this?  (Patient shown scale and patient rated each activity)  Follow up: When you first came in you had difficulty performing these activities.  Today do you still have difficulty?  Patient-Specific activity scoring scheme (Point to  one number):  0 1 2 3 4 5 6 7 8 9  10 Unable                                                                                                          Able to perform To perform                                                                                                    activity at the same Activity         Level as before                                                                                                                       Injury or problem  Activity        Picking up bulky items/small box from the floor                                  8/13:       2                2.         Sitting for work/staff meeting                                                                8/13:    1                    3.          Walking at a more normal or faster speed  2 miles  8/13:          4                 TODAY'S TREATMENT:      DATE: 12/16/2022 6 MINUTE WALK:  1,366 ft with pt reporting "it just feels irritated" after the walk Dead lift weights 15# kettle bell with legs against RitFit lowering to 4" 2x10 Side step on half foam 2x10 bilat Standing on half foam with some support from other foot on the floor - chest press 4lb weight 2x10 bilat Hip flexion lunge with stretch on step 20x bil Trigger Point Dry-Needling  Treatment instructions: Expect mild to moderate muscle soreness. S/S of pneumothorax if dry needled over a lung field, and to seek immediate medical attention should they occur. Patient verbalized understanding of these instructions and education. Patient Consent Given: Yes Education handout provided: Previously provided Muscles treated: bilateral thoracic and lumbar multifidi Electrical stimulation performed: No Parameters: N/A Treatment response/outcome: Utilized skilled palpation to identify bony landmarks and trigger points.  Able to illicit twitch response and muscle elongation.  Soft tissue mobilization following to further promote  tissue elongation.      12/10/2022 Lumbar ROM SLS Hip MMT Lumbar/trunk strength testing Bird dogs Review of HEP Manual therapy Bil lumbar paraspinals; left QL, left piriformis Trigger Point Dry-Needling  Treatment instructions: Expect mild to moderate muscle soreness. S/S of pneumothorax if dry needled over a lung field, and to seek immediate medical attention should they occur. Patient verbalized understanding of these instructions and education.  Patient Consent Given: Yes Education handout provided: Previously provided Muscles treated: lumbar multifidi bil; sidelying proximal piriformis left; QL left Electrical stimulation performed: No Parameters: N/A Treatment response/outcome: increased soft tissue length and twitch response Dec 07, 2022 Exercise and NMRE  Dead lift weights 10 and 15# kettle bell with legs against step lowering to 4" Hip flexion lunge with stretch on step 20x bil Backward walk with sports cord - 15lb Side step sports cord - 10lb Side step on half foam Standing on half foam with some support from other foot on the floor - lift 4lb weight Archer punches with thoracic rotation - 5lb pulley Single leg dead lift reaching 3 ways placing cone down  Manual therapy  Bil thoracic and lumbar paraspinals Trigger Point Dry-Needling  Treatment instructions: Expect mild to moderate muscle soreness. S/S of pneumothorax if dry needled over a lung field, and to seek immediate medical attention should they occur. Patient verbalized understanding of these instructions and education.  Patient Consent Given: Yes Education handout provided: Previously provided Muscles treated: lumbar multifidi Electrical stimulation performed: No Parameters: N/A Treatment response/outcome: increased soft tissue length and twitch response   2022-12-01 Exercise and NMRE  Dead lift weights 10 and 15# kettle bell with legs against step lowering to 4" Hip flexion lunge with stretch on step  20x bil Backward walk with sports cord - 10lb Deep squat sits with hip rotation and ball rolling side to side Side bend on the peanut Lunge with band press out Archer punches with thoracic rotation   Manual therapy   Bil thoracic and lumbar paraspinals Trigger Point Dry-Needling  Treatment instructions: Expect mild to moderate muscle soreness. S/S of pneumothorax if dry needled over a lung field, and to seek immediate medical attention should they occur. Patient verbalized understanding of these instructions and education.  Patient Consent Given: Yes Education handout provided: Yes Muscles treated: lumbar and thoracic paraspinals Electrical stimulation performed: No Parameters: N/A Treatment response/outcome: increased tissue mobility  PATIENT EDUCATION:  Education details: Educated patient on anatomy and physiology of current symptoms, prognosis, plan of care as well as initial self care strategies to promote recovery Use of lumbar roll when sitting to avoid sacral sitting Person educated: Patient Education method: Explanation Education comprehension: verbalized understanding  HOME EXERCISE PROGRAM: Access Code: [email or web address] URL: [email or web address] Date: 09/25/2022 Prepared by: Clydie Braun Carolyn Maniscalco  Exercises - Supine Piriformis Stretch (Mirrored)  - 1 x daily - 7 x weekly - 1 sets - 3 reps - 20 hold - Clamshell  - 1 x daily - 7 x weekly - 1 sets - 10 reps - Hooklying Isometric Hip Flexion  - 1 x daily - 7 x weekly - 1 sets - 5 reps - Hooklying Sequential Leg March and Lower  - 1 x daily - 7 x weekly - 1 sets - 10 reps - Plank on Knees  - 1 x daily - 7 x weekly - 1 sets - 10 reps - Quadruped Hip Extension Kicks  - 1 x daily - 7 x weekly - 2 sets - 10 reps - Quadruped Rocking Slow  - 1 x daily - 7 x weekly - 1 sets - 10 reps - Standing Hip Internal  and External Rotation AROM  - 1 x daily - 7 x weekly - 1 sets - 10 reps - Half Kneeling Hip Flexor Stretch with Tri-Planar Reach  - 1 x daily - 7 x weekly - 3 sets - 10 reps - Quadruped Cat Cow  - 1 x daily - 7 x weekly - 3 sets - 10 reps - Quadruped Thoracic Spine Extension  - 1 x daily - 7 x weekly - 3 sets - 10 reps - Quadruped Thoracic Lumbar Side Bend  - 1 x daily - 7 x weekly - 3 sets - 10 reps  ASSESSMENT:  CLINICAL IMPRESSION: Ms Alhassan presents to skilled PT reporting that overall, she feels that she is making progress.  Patient able to participate in 6 minute ambulation during session today with some irritation of pain reported.  Patient with strong twitch response to dry needling along left thoracic and lumbar multifidi and right lumbar multifidi.  Patient reports feeling looser following manual therapy/dry needling.   OBJECTIVE IMPAIRMENTS: decreased activity tolerance, decreased mobility, difficulty walking, decreased ROM, decreased strength, increased fascial restrictions, impaired perceived functional ability, and pain.   ACTIVITY LIMITATIONS: sitting, standing, sleeping, and locomotion level  PARTICIPATION LIMITATIONS: driving, community activity, and occupation  PERSONAL FACTORS: Past/current experiences and Time since onset of injury/illness/exacerbation are also affecting patient's functional outcome.   REHAB POTENTIAL: Good  CLINICAL DECISION MAKING: Stable/uncomplicated  EVALUATION COMPLEXITY: Low   GOALS: Goals reviewed with patient? Yes  SHORT TERM GOALS: Target date: 09/24/2022    Updated (09/18/22)   The patient will demonstrate knowledge of basic self care strategies and exercises to promote healing   Baseline: Goal status: MET  2.  The patient will report a 30% improvement in pain levels with functional activities which are currently difficult including sitting for work and traveling  (upcoming trip to [place]) Baseline:  Goal status: met 6/18 3.  The  patient will have improved hip strength to at least 4+/5 needed for standing, walking inclines, walking longer distances  Baseline:  Goal status: MET on 09/25/2022  4. Improved right hip internal rotation ROM to 15 degrees needed for improved  Baseline:  Goal status: MET on 09/25/2022   LONG TERM GOALS: Target date: 03/04/2023   The patient will be independent  in a safe self progression of a home exercise program to promote further recovery of function  Baseline:  Goal status: ongoing 2.  The patient will report a 70% improvement in pain levels with functional activities which are currently difficult including sitting, lifting Baseline:   8/13 65% Goal status: ongoing  3.  The patient will have improved trunk flexor and extensor muscle strength to at least 5-/5 needed for lifting medium to heavier weight objects such as moving boxes, laundry and luggage  Baseline: if it is more than around 10 lbs then I feel the discomfort or sore in the low back or tailbone Goal status: ongoing  4.  Improved sitting tolerance for flight to [place] and gait stamina for walking moderate community distances for travel Baseline: able to travel to [place] and did well with that Goal status: MET  5.  Modified Oswestry score improved to 30% indicating improved function with less pain Baseline: 16% Goal status:MET  6.  Patient will report ability to lift/carry moving boxes improved to 4/10 on Patient functional specific scale NEW  7.  Patient will be to sit for work and meetings with a rating of 4/10 on patient functional specific scale NEW  8. Patient will be able to do a 2 mile walk with normal to faster gait speed with a rating of 6/10 Ongoing     PLAN:  PT FREQUENCY: 1x/week  PT DURATION: 12 weeks  PLANNED INTERVENTIONS: Therapeutic exercises, Therapeutic activity, Neuromuscular re-education, Patient/Family education, Self Care, Joint mobilization, Aquatic Therapy, Dry Needling, Spinal mobilization,  Cryotherapy, Moist heat, Taping, Traction, Ionotophoresis 4mg /ml Dexamethasone, Manual therapy, and Re-evaluation.  PLAN FOR NEXT SESSION:strengthen left QL, left glute medius, core/trunk strengthening; DN as needed   Reather Laurence, PT, DPT 12/16/22, 9:52 AM  [facility] [address] [place], [place] [address] Phone # [phone] Fax [phone]

## 2022-12-17 ENCOUNTER — Other Ambulatory Visit (HOSPITAL_COMMUNITY): Payer: Self-pay

## 2022-12-17 DIAGNOSIS — Z01419 Encounter for gynecological examination (general) (routine) without abnormal findings: Secondary | ICD-10-CM | POA: Diagnosis not present

## 2022-12-17 MED ORDER — NORETHIN-ETH ESTRADIOL-FE 0.8-25 MG-MCG PO CHEW
1.0000 | CHEWABLE_TABLET | Freq: Every day | ORAL | 3 refills | Status: AC
  Filled 2022-12-17: qty 112, 84d supply, fill #0
  Filled 2023-03-07: qty 112, 84d supply, fill #1

## 2022-12-20 DIAGNOSIS — F4322 Adjustment disorder with anxiety: Secondary | ICD-10-CM | POA: Diagnosis not present

## 2022-12-24 ENCOUNTER — Ambulatory Visit: Payer: 59 | Admitting: Rehabilitative and Restorative Service Providers"

## 2022-12-24 ENCOUNTER — Encounter: Payer: Self-pay | Admitting: Rehabilitative and Restorative Service Providers"

## 2022-12-24 DIAGNOSIS — M5459 Other low back pain: Secondary | ICD-10-CM

## 2022-12-24 DIAGNOSIS — R531 Weakness: Secondary | ICD-10-CM

## 2022-12-24 NOTE — Therapy (Signed)
OUTPATIENT PHYSICAL THERAPY TREATMENT NOTE   Patient Name: Kathryn Cohen MRN: [id number] DOB:14-Apr-1976, 47 y.o., female Today's Date: 08/27/22  END OF SESSION:  PT End of Session - 12/24/22 1017     Visit Number 21    Date for PT Re-Evaluation 03/04/23    Authorization Type [facility] [employer]    PT Start Time 1015    PT Stop Time 1055    PT Time Calculation (min) 40 min    Activity Tolerance Patient tolerated treatment well    Behavior During Therapy WFL for tasks assessed/performed                      Past Medical History:  Diagnosis Date   Allergy    Asthma    Back pain    Breast hypertrophy    Laceration of hand    rt long, ring and small fingers   Neck pain    Past Surgical History:  Procedure Laterality Date   BREAST REDUCTION SURGERY Bilateral 03/14/2017   Procedure: BILATERAL MAMMARY REDUCTION  (BREAST);  Surgeon: Glenna Fellows, MD;  Location: [facility];  Service: Plastics;  Laterality: Bilateral;   NERVE, TENDON AND ARTERY REPAIR Right 02/02/2015   Procedure: RIGHT LONG RING SMALL FINGER REPAIR NERVE, TENDON AND ARTERY REPAIR;  Surgeon: Betha Loa, MD;  Location: [facility];  Service: Orthopedics;  Laterality: Right;   REDUCTION MAMMAPLASTY Bilateral 02/2017   Patient Active Problem List   Diagnosis Date Noted   Asthma 02/24/2011   Allergic rhinitis 02/24/2011    PCP: Polite,Ronald MD  REFERRING PROVIDER: Polite,Ronald MD  REFERRING DIAG: low back pain  Rationale for Evaluation and Treatment: Rehabilitation  THERAPY DIAG:  Back pain; weakness  ONSET DATE: 04/29/2022  SUBJECTIVE:                                                                                                                                                                                           SUBJECTIVE STATEMENT: Pt reports having some pain/soreness immediately following dry needling last session, but states that following day  and later she felt the benefits and release and her muscles felt less pain.  Pt reports feeling better today than session last week.   PERTINENT HISTORY:  Goes by [name] Ongoing problem related to a fall off horse at 47 years old; resolved but lifted something heavy triggered an exacerbation; then college age back spasms; got PT and acupuncture (bad experience lots of providers, lying lifted leg with weight worsened) Recent illness lying down flared up back Lots of recent travel may have aggravated Patient unable to  sit for the evaluation (standing more comfortable) 59 yo daughter PAIN:  PAIN:  Are you having pain? Yes NPRS scale: 1/10 Pain location: feels like a pebble at the tailbone; left low back and lateral hip  Aggravating factors: sitting > 1 hour sharp pains 7/10; walking inclines will hurt  Relieving factors: swims; stretching midback; yoga every morning; usually walking ok on level ground  PRECAUTIONS: None  WEIGHT BEARING RESTRICTIONS: No  FALLS:  Has patient fallen in last 6 months? No  LIVING ENVIRONMENT: Lives with: lives with their family Lives in: House/apartment   OCCUPATION: standing desk, tries to limit sitting  PLOF: Independent  PATIENT GOALS: reduce my pain; more functional I can barely sit at all; wants to travel;  mid June airplane [place];  need to be able to walk normal speed keep up with daughter 8 year old  NEXT MD VISIT: nothing scheduled  OBJECTIVE:   DIAGNOSTIC FINDINGS:  X-rays Mild degen lower thoracic  PATIENT SURVEYS:  Modified Oswestry: 40% moderate disability 6/4: 32% 7/9: 8/50 = 16% goal met  COGNITION: Overall cognitive status: Within functional limits for tasks assessed       MUSCLE LENGTH: Hamstrings: Right 75 deg; Left 75 deg Thomas test: Right 10 deg; Left 10 deg  POSTURE: No Significant postural limitations  PALPATION: Decreased QL mobility  LUMBAR ROM:   AROM eval 6/18 8/13  Flexion Fingertips to shoes with feet  together full Full 72  Extension 20 27 34  Right lateral flexion 20 25 45  Left lateral flexion 20 25 45  Right rotation     Left rotation      (Blank rows = not tested)  LOWER EXTREMITY ROM:   decreased right hip internal rotation ROM 5 degrees; left internal rotation 12 degrees  09/25/2022: Bilateral hip internal rotation is 25 degrees  TRUNK STRENGTH:  Decreased activation of transverse abdominus muscles; abdominals 4/5; decreased activation of lumbar multifidi; trunk extensors 4/5  Bird dog 4/5; modified plank 4/5; bent knee lift and lower= abs 4/5  6/18:  TRUNK STRENGTH GROSSLY 4+/5 8/13: TRUNK STRENGTH:  Decreased activation of transverse abdominus muscles; abdominals 4+/5; decreased activation of lumbar multifidi; trunk extensors 4+/5   LOWER EXTREMITY MMT:    MMT Right eval Left eval 6/11 8/13  Hip flexion 5 5 5 5   Hip extension 4 4 4+ 4+  Hip abduction 4 4 4+ 4+  Hip adduction      Hip internal rotation      Hip external rotation 4 4 4+ 4+  Knee flexion      Knee extension 5 5 5 5   Ankle dorsiflexion      Ankle plantarflexion      Ankle inversion      Ankle eversion       (Blank rows = not tested)  09/25/2022: Right hip flexion strength of 5-/5 Left hip flexion strength of 4+/5  6/18:  left hip flexion 5-/5 8/13:  left hip flexion 5-/5  LUMBAR SPECIAL TESTS:  Straight leg raise test: Negative and Single leg stance test: Negative   GAIT: Comments: decreased gait speed 12/16/2022: 6 MINUTE WALK:  1,366 ft with pt reporting "it just feels irritated" after the walk  The Patient-Specific Functional Scale  Initial:  I am going to ask you to identify up to 3 important activities that you are unable to do or are having difficulty with as a result of this problem.  Today are there any activities that you are unable to do  or having difficulty with because of this?  (Patient shown scale and patient rated each activity)  Follow up: When you first came in you had  difficulty performing these activities.  Today do you still have difficulty?  Patient-Specific activity scoring scheme (Point to one number):  0 1 2 3 4 5 6 7 8 9  10 Unable                                                                                                          Able to perform To perform                                                                                                    activity at the same Activity         Level as before                                                                                                                       Injury or problem  Activity        Picking up bulky items/small box from the floor                                  8/13:       2                2.         Sitting for work/staff meeting                                                                8/13:    1                    3.          Walking at a more normal or faster speed  2 miles  8/13:          4                 TODAY'S TREATMENT:      DATE: 12/24/2022: Nustep level 5 x6 min with PT present to discuss status Dead lift weights 15# kettle bell with legs against RitFit lowering to just below knees 2x10 FWD step ups onto bosu x10 bilat Side step ups and over on bosu x10 bilat Hip flexion lunge with stretch on step 20x bil Quadruped bird dog 2x10 bilat Quadruped Academic librarian with green loop around knees x10 bilat Side-lying reverse clamshell with green loop 2x10 bilat Bear plank 5x5 sec hold Seated lumbar extension with Heavy purple power cord (PT holding resistance with cord around back) 2x10 Side lunge with unilateral foot on slider x10 bilat Backwards lunge with unilateral foot on slider x10 bilat   12/16/2022 6 MINUTE WALK:  1,366 ft with pt reporting "it just feels irritated" after the walk Dead lift weights 15# kettle bell with legs against RitFit lowering to 4" 2x10 Side step on half foam 2x10 bilat Standing on half foam  with some support from other foot on the floor - chest press 4lb weight 2x10 bilat Hip flexion lunge with stretch on step 20x bil Trigger Point Dry-Needling  Treatment instructions: Expect mild to moderate muscle soreness. S/S of pneumothorax if dry needled over a lung field, and to seek immediate medical attention should they occur. Patient verbalized understanding of these instructions and education. Patient Consent Given: Yes Education handout provided: Previously provided Muscles treated: bilateral thoracic and lumbar multifidi Electrical stimulation performed: No Parameters: N/A Treatment response/outcome: Utilized skilled palpation to identify bony landmarks and trigger points.  Able to illicit twitch response and muscle elongation.  Soft tissue mobilization following to further promote tissue elongation.      12/10/2022 Lumbar ROM SLS Hip MMT Lumbar/trunk strength testing Bird dogs Review of HEP Manual therapy Bil lumbar paraspinals; left QL, left piriformis Trigger Point Dry-Needling  Treatment instructions: Expect mild to moderate muscle soreness. S/S of pneumothorax if dry needled over a lung field, and to seek immediate medical attention should they occur. Patient verbalized understanding of these instructions and education.  Patient Consent Given: Yes Education handout provided: Previously provided Muscles treated: lumbar multifidi bil; sidelying proximal piriformis left; QL left Electrical stimulation performed: No Parameters: N/A Treatment response/outcome: increased soft tissue length and twitch response                                                                                                                      PATIENT EDUCATION:  Education details: Educated patient on anatomy and physiology of current symptoms, prognosis, plan of care as well as initial self care strategies to promote recovery Use of lumbar roll when sitting to avoid sacral sitting Person  educated: Patient Education method: Explanation Education comprehension: verbalized understanding  HOME EXERCISE PROGRAM: Access Code: [email or web address] URL: [email or web address] Date: 09/25/2022 Prepared by: Reather Laurence  Exercises - Supine  Piriformis Stretch (Mirrored)  - 1 x daily - 7 x weekly - 1 sets - 3 reps - 20 hold - Clamshell  - 1 x daily - 7 x weekly - 1 sets - 10 reps - Hooklying Isometric Hip Flexion  - 1 x daily - 7 x weekly - 1 sets - 5 reps - Hooklying Sequential Leg March and Lower  - 1 x daily - 7 x weekly - 1 sets - 10 reps - Plank on Knees  - 1 x daily - 7 x weekly - 1 sets - 10 reps - Quadruped Hip Extension Kicks  - 1 x daily - 7 x weekly - 2 sets - 10 reps - Quadruped Rocking Slow  - 1 x daily - 7 x weekly - 1 sets - 10 reps - Standing Hip Internal and External Rotation AROM  - 1 x daily - 7 x weekly - 1 sets - 10 reps - Half Kneeling Hip Flexor Stretch with Tri-Planar Reach  - 1 x daily - 7 x weekly - 3 sets - 10 reps - Quadruped Cat Cow  - 1 x daily - 7 x weekly - 3 sets - 10 reps - Quadruped Thoracic Spine Extension  - 1 x daily - 7 x weekly - 3 sets - 10 reps - Quadruped Thoracic Lumbar Side Bend  - 1 x daily - 7 x weekly - 3 sets - 10 reps  ASSESSMENT:  CLINICAL IMPRESSION: Ms Ginn presents to skilled PT reporting feeling better today than last week.  Patient is agreeable to not trying dry needling today secondary to decreased pain overall.  Patient able to progress with strengthening and hip stability during session.  Patient with good participation and reported feeling good muscle response to strengthening exercises.   OBJECTIVE IMPAIRMENTS: decreased activity tolerance, decreased mobility, difficulty walking, decreased ROM, decreased strength, increased fascial restrictions, impaired perceived functional ability, and pain.   ACTIVITY LIMITATIONS: sitting, standing, sleeping, and locomotion level  PARTICIPATION LIMITATIONS: driving, community  activity, and occupation  PERSONAL FACTORS: Past/current experiences and Time since onset of injury/illness/exacerbation are also affecting patient's functional outcome.   REHAB POTENTIAL: Good  CLINICAL DECISION MAKING: Stable/uncomplicated  EVALUATION COMPLEXITY: Low   GOALS: Goals reviewed with patient? Yes  SHORT TERM GOALS: Target date: 09/24/2022    Updated (09/18/22)   The patient will demonstrate knowledge of basic self care strategies and exercises to promote healing   Baseline: Goal status: MET  2.  The patient will report a 30% improvement in pain levels with functional activities which are currently difficult including sitting for work and traveling  (upcoming trip to [place]) Baseline:  Goal status: met 6/18 3.  The patient will have improved hip strength to at least 4+/5 needed for standing, walking inclines, walking longer distances  Baseline:  Goal status: MET on 09/25/2022  4. Improved right hip internal rotation ROM to 15 degrees needed for improved  Baseline:  Goal status: MET on 09/25/2022   LONG TERM GOALS: Target date: 03/04/2023   The patient will be independent in a safe self progression of a home exercise program to promote further recovery of function  Baseline:  Goal status: ongoing  2.  The patient will report a 70% improvement in pain levels with functional activities which are currently difficult including sitting, lifting Baseline:   8/13 65% Goal status: ongoing  3.  The patient will have improved trunk flexor and extensor muscle strength to at least 5-/5 needed for lifting medium to  heavier weight objects such as moving boxes, laundry and luggage  Baseline: if it is more than around 10 lbs then I feel the discomfort or sore in the low back or tailbone Goal status: ongoing  4.  Improved sitting tolerance for flight to [place] and gait stamina for walking moderate community distances for travel Baseline: able to travel to [place] and did well with  that Goal status: MET  5.  Modified Oswestry score improved to 30% indicating improved function with less pain Baseline: 16% Goal status:MET  6.  Patient will report ability to lift/carry moving boxes improved to 4/10 on Patient functional specific scale NEW  7.  Patient will be to sit for work and meetings with a rating of 4/10 on patient functional specific scale Ongoing  8. Patient will be able to do a 2 mile walk with normal to faster gait speed with a rating of 6/10 Ongoing     PLAN:  PT FREQUENCY: 1x/week  PT DURATION: 12 weeks  PLANNED INTERVENTIONS: Therapeutic exercises, Therapeutic activity, Neuromuscular re-education, Patient/Family education, Self Care, Joint mobilization, Aquatic Therapy, Dry Needling, Spinal mobilization, Cryotherapy, Moist heat, Taping, Traction, Ionotophoresis 4mg /ml Dexamethasone, Manual therapy, and Re-evaluation.  PLAN FOR NEXT SESSION:strengthen left QL, left glute medius, core/trunk strengthening; DN as needed   Reather Laurence, PT, DPT 12/24/22, 11:04 AM  [facility] [address], [address] [place], [place] [address] Phone # [phone] Fax [phone]

## 2023-01-03 ENCOUNTER — Ambulatory Visit: Payer: 59 | Attending: Internal Medicine | Admitting: Physical Therapy

## 2023-01-03 DIAGNOSIS — M5459 Other low back pain: Secondary | ICD-10-CM | POA: Diagnosis not present

## 2023-01-03 DIAGNOSIS — R531 Weakness: Secondary | ICD-10-CM | POA: Insufficient documentation

## 2023-01-03 NOTE — Therapy (Signed)
OUTPATIENT PHYSICAL THERAPY TREATMENT NOTE   Patient Name: Kathryn Cohen MRN: [id number] DOB:February 26, 1976, 47 y.o., female Today's Date: 08/27/22  END OF SESSION:  PT End of Session - 01/03/23 0850     Visit Number 22    Date for PT Re-Evaluation 03/04/23    Authorization Type [facility] [employer]    PT Start Time [phone]    PT Stop Time 0927    PT Time Calculation (min) 41 min    Activity Tolerance Patient tolerated treatment well                      Past Medical History:  Diagnosis Date   Allergy    Asthma    Back pain    Breast hypertrophy    Laceration of hand    rt long, ring and small fingers   Neck pain    Past Surgical History:  Procedure Laterality Date   BREAST REDUCTION SURGERY Bilateral 03/14/2017   Procedure: BILATERAL MAMMARY REDUCTION  (BREAST);  Surgeon: Glenna Fellows, MD;  Location: [facility];  Service: Plastics;  Laterality: Bilateral;   NERVE, TENDON AND ARTERY REPAIR Right 02/02/2015   Procedure: RIGHT LONG RING SMALL FINGER REPAIR NERVE, TENDON AND ARTERY REPAIR;  Surgeon: Betha Loa, MD;  Location: [facility];  Service: Orthopedics;  Laterality: Right;   REDUCTION MAMMAPLASTY Bilateral 02/2017   Patient Active Problem List   Diagnosis Date Noted   Asthma 02/24/2011   Allergic rhinitis 02/24/2011    PCP: Polite,Ronald MD  REFERRING PROVIDER: Polite,Ronald MD  REFERRING DIAG: low back pain  Rationale for Evaluation and Treatment: Rehabilitation  THERAPY DIAG:  Back pain; weakness  ONSET DATE: 04/29/2022  SUBJECTIVE:                                                                                                                                                                                           SUBJECTIVE STATEMENT: A little discomfort driving here this morning but not really pain now.  Requests DN to left lateral hip/thigh.  I've been much more active and that's been a good  thing.   PERTINENT HISTORY:  Goes by [name] Ongoing problem related to a fall off horse at 48 years old; resolved but lifted something heavy triggered an exacerbation; then college age back spasms; got PT and acupuncture (bad experience lots of providers, lying lifted leg with weight worsened) Recent illness lying down flared up back Lots of recent travel may have aggravated Patient unable to sit for the evaluation (standing more comfortable) 73 yo daughter PAIN:  PAIN:  Are you having pain? Yes  NPRS scale: 1/10 Pain location: feels like a pebble at the tailbone; left low back and lateral hip  Aggravating factors: sitting > 1 hour sharp pains 7/10; walking inclines will hurt  Relieving factors: swims; stretching midback; yoga every morning; usually walking ok on level ground  PRECAUTIONS: None  WEIGHT BEARING RESTRICTIONS: No  FALLS:  Has patient fallen in last 6 months? No  LIVING ENVIRONMENT: Lives with: lives with their family Lives in: House/apartment   OCCUPATION: standing desk, tries to limit sitting  PLOF: Independent  PATIENT GOALS: reduce my pain; more functional I can barely sit at all; wants to travel;  mid June airplane [place];  need to be able to walk normal speed keep up with daughter 74 year old  NEXT MD VISIT: nothing scheduled  OBJECTIVE:   DIAGNOSTIC FINDINGS:  X-rays Mild degen lower thoracic  PATIENT SURVEYS:  Modified Oswestry: 40% moderate disability 6/4: 32% 7/9: 8/50 = 16% goal met  COGNITION: Overall cognitive status: Within functional limits for tasks assessed       MUSCLE LENGTH: Hamstrings: Right 75 deg; Left 75 deg Thomas test: Right 10 deg; Left 10 deg  POSTURE: No Significant postural limitations  PALPATION: Decreased QL mobility  LUMBAR ROM:   AROM eval 6/18 8/13  Flexion Fingertips to shoes with feet together full Full 72  Extension 20 27 34  Right lateral flexion 20 25 45  Left lateral flexion 20 25 45  Right rotation      Left rotation      (Blank rows = not tested)  LOWER EXTREMITY ROM:   decreased right hip internal rotation ROM 5 degrees; left internal rotation 12 degrees  09/25/2022: Bilateral hip internal rotation is 25 degrees  TRUNK STRENGTH:  Decreased activation of transverse abdominus muscles; abdominals 4/5; decreased activation of lumbar multifidi; trunk extensors 4/5  Bird dog 4/5; modified plank 4/5; bent knee lift and lower= abs 4/5  6/18:  TRUNK STRENGTH GROSSLY 4+/5 8/13: TRUNK STRENGTH:  Decreased activation of transverse abdominus muscles; abdominals 4+/5; decreased activation of lumbar multifidi; trunk extensors 4+/5   LOWER EXTREMITY MMT:    MMT Right eval Left eval 6/11 8/13  Hip flexion 5 5 5 5   Hip extension 4 4 4+ 4+  Hip abduction 4 4 4+ 4+  Hip adduction      Hip internal rotation      Hip external rotation 4 4 4+ 4+  Knee flexion      Knee extension 5 5 5 5   Ankle dorsiflexion      Ankle plantarflexion      Ankle inversion      Ankle eversion       (Blank rows = not tested)  09/25/2022: Right hip flexion strength of 5-/5 Left hip flexion strength of 4+/5  6/18:  left hip flexion 5-/5 8/13:  left hip flexion 5-/5  LUMBAR SPECIAL TESTS:  Straight leg raise test: Negative and Single leg stance test: Negative   GAIT: Comments: decreased gait speed 12/16/2022: 6 MINUTE WALK:  1,366 ft with pt reporting "it just feels irritated" after the walk  The Patient-Specific Functional Scale  Initial:  I am going to ask you to identify up to 3 important activities that you are unable to do or are having difficulty with as a result of this problem.  Today are there any activities that you are unable to do or having difficulty with because of this?  (Patient shown scale and patient rated each activity)  Follow up:  When you first came in you had difficulty performing these activities.  Today do you still have difficulty?  Patient-Specific activity scoring scheme (Point to  one number):  0 1 2 3 4 5 6 7 8 9  10 Unable                                                                                                          Able to perform To perform                                                                                                    activity at the same Activity         Level as before                                                                                                                       Injury or problem  Activity        Picking up bulky items/small box from the floor                                  8/13:       2                2.         Sitting for work/staff meeting                                                                8/13:    1                    3.          Walking at a more normal or faster speed  2 miles  8/13:          4                 TODAY'S TREATMENT:     DATE: 01/03/2023: Nustep level 5 x6 min with PT present to discuss status Supine red ball isometric holds on hands/thighs with opp arm leg lift off 10x 1/2 kneel blue band diagonal chops 10x right/left Dead lift weights 2 8# dumbbells to just below knees x10 FWD step ups onto bosu x10 bilat Side step ups and over on bosu x10 bilat Quadruped bent knee hip extension 10x right/left  Quadruped fire hydrant  x10 bilat Bear plank 7x5 sec hold Sit to stand holding 2 8# dumbbells 10x Leg press seat 6 80# bil 15x Trigger Point Dry-Needling  Treatment instructions: Expect mild to moderate muscle soreness. S/S of pneumothorax if dry needled over a lung field, and to seek immediate medical attention should they occur. Patient verbalized understanding of these instructions and education. Patient Consent Given: Yes Education handout provided: Previously provided Muscles treated: left gluteals and upper ITB Electrical stimulation performed: No Parameters: N/A Treatment response/outcome: Utilized skilled palpation to identify bony  landmarks and trigger points.  Able to illicit twitch response and muscle elongation.  Soft tissue mobilization following to further promote tissue elongation.    DATE: 12/24/2022: Nustep level 5 x6 min with PT present to discuss status Dead lift weights 15# kettle bell with legs against RitFit lowering to just below knees 2x10 FWD step ups onto bosu x10 bilat Side step ups and over on bosu x10 bilat Hip flexion lunge with stretch on step 20x bil Quadruped bird dog 2x10 bilat Quadruped Academic librarian with green loop around knees x10 bilat Side-lying reverse clamshell with green loop 2x10 bilat Bear plank 5x5 sec hold Seated lumbar extension with Heavy purple power cord (PT holding resistance with cord around back) 2x10 Side lunge with unilateral foot on slider x10 bilat Backwards lunge with unilateral foot on slider x10 bilat   12/16/2022 6 MINUTE WALK:  1,366 ft with pt reporting "it just feels irritated" after the walk Dead lift weights 15# kettle bell with legs against RitFit lowering to 4" 2x10 Side step on half foam 2x10 bilat Standing on half foam with some support from other foot on the floor - chest press 4lb weight 2x10 bilat Hip flexion lunge with stretch on step 20x bil Trigger Point Dry-Needling  Treatment instructions: Expect mild to moderate muscle soreness. S/S of pneumothorax if dry needled over a lung field, and to seek immediate medical attention should they occur. Patient verbalized understanding of these instructions and education. Patient Consent Given: Yes Education handout provided: Previously provided Muscles treated: bilateral thoracic and lumbar multifidi Electrical stimulation performed: No Parameters: N/A Treatment response/outcome: Utilized skilled palpation to identify bony landmarks and trigger points.  Able to illicit twitch response and muscle elongation.  Soft tissue mobilization following to further promote tissue elongation.      12/10/2022 Lumbar  ROM SLS Hip MMT Lumbar/trunk strength testing Bird dogs Review of HEP Manual therapy Bil lumbar paraspinals; left QL, left piriformis Trigger Point Dry-Needling  Treatment instructions: Expect mild to moderate muscle soreness. S/S of pneumothorax if dry needled over a lung field, and to seek immediate medical attention should they occur. Patient verbalized understanding of these instructions and education.  Patient Consent Given: Yes Education handout provided: Previously provided Muscles treated: lumbar multifidi bil; sidelying proximal piriformis left; QL left Electrical stimulation performed: No Parameters: N/A Treatment response/outcome: increased  soft tissue length and twitch response                                                                                                                      PATIENT EDUCATION:  Education details: Educated patient on anatomy and physiology of current symptoms, prognosis, plan of care as well as initial self care strategies to promote recovery Use of lumbar roll when sitting to avoid sacral sitting Person educated: Patient Education method: Explanation Education comprehension: verbalized understanding  HOME EXERCISE PROGRAM: Access Code: [email or web address] URL: [email or web address] Date: 09/25/2022 Prepared by: Clydie Braun Menke  Exercises - Supine Piriformis Stretch (Mirrored)  - 1 x daily - 7 x weekly - 1 sets - 3 reps - 20 hold - Clamshell  - 1 x daily - 7 x weekly - 1 sets - 10 reps - Hooklying Isometric Hip Flexion  - 1 x daily - 7 x weekly - 1 sets - 5 reps - Hooklying Sequential Leg March and Lower  - 1 x daily - 7 x weekly - 1 sets - 10 reps - Plank on Knees  - 1 x daily - 7 x weekly - 1 sets - 10 reps - Quadruped Hip Extension Kicks  - 1 x daily - 7 x weekly - 2 sets - 10 reps - Quadruped Rocking Slow  - 1 x daily - 7 x weekly - 1 sets - 10 reps - Standing Hip Internal and External Rotation AROM  - 1 x daily - 7 x weekly  - 1 sets - 10 reps - Half Kneeling Hip Flexor Stretch with Tri-Planar Reach  - 1 x daily - 7 x weekly - 3 sets - 10 reps - Quadruped Cat Cow  - 1 x daily - 7 x weekly - 3 sets - 10 reps - Quadruped Thoracic Spine Extension  - 1 x daily - 7 x weekly - 3 sets - 10 reps - Quadruped Thoracic Lumbar Side Bend  - 1 x daily - 7 x weekly - 3 sets - 10 reps  ASSESSMENT:  CLINICAL IMPRESSION: Decreasing pain sensitivity and improved functional mobility/ease of movement.  Able to perform moderate intensity core and LE strengthening without pain exacerbation.  Verbal cues for forward gaze with dead lifts.  The patient benefits significantly from dry needling and manual therapy to stimulate underlying myofascial trigger points and muscular tissue for management of neuromusculoskeletal pain and address movement impairments.    OBJECTIVE IMPAIRMENTS: decreased activity tolerance, decreased mobility, difficulty walking, decreased ROM, decreased strength, increased fascial restrictions, impaired perceived functional ability, and pain.   ACTIVITY LIMITATIONS: sitting, standing, sleeping, and locomotion level  PARTICIPATION LIMITATIONS: driving, community activity, and occupation  PERSONAL FACTORS: Past/current experiences and Time since onset of injury/illness/exacerbation are also affecting patient's functional outcome.   REHAB POTENTIAL: Good  CLINICAL DECISION MAKING: Stable/uncomplicated  EVALUATION COMPLEXITY: Low   GOALS: Goals reviewed with patient? Yes  SHORT TERM GOALS: Target date: 09/24/2022    Updated (09/18/22)   The patient will  demonstrate knowledge of basic self care strategies and exercises to promote healing   Baseline: Goal status: MET  2.  The patient will report a 30% improvement in pain levels with functional activities which are currently difficult including sitting for work and traveling  (upcoming trip to [place]) Baseline:  Goal status: met 6/18 3.  The patient will have  improved hip strength to at least 4+/5 needed for standing, walking inclines, walking longer distances  Baseline:  Goal status: MET on 09/25/2022  4. Improved right hip internal rotation ROM to 15 degrees needed for improved  Baseline:  Goal status: MET on 09/25/2022   LONG TERM GOALS: Target date: 03/04/2023   The patient will be independent in a safe self progression of a home exercise program to promote further recovery of function  Baseline:  Goal status: ongoing  2.  The patient will report a 70% improvement in pain levels with functional activities which are currently difficult including sitting, lifting Baseline:   8/13 65% Goal status: ongoing  3.  The patient will have improved trunk flexor and extensor muscle strength to at least 5-/5 needed for lifting medium to heavier weight objects such as moving boxes, laundry and luggage  Baseline: if it is more than around 10 lbs then I feel the discomfort or sore in the low back or tailbone Goal status: ongoing  4.  Improved sitting tolerance for flight to [place] and gait stamina for walking moderate community distances for travel Baseline: able to travel to [place] and did well with that Goal status: MET  5.  Modified Oswestry score improved to 30% indicating improved function with less pain Baseline: 16% Goal status:MET  6.  Patient will report ability to lift/carry moving boxes improved to 4/10 on Patient functional specific scale NEW  7.  Patient will be to sit for work and meetings with a rating of 4/10 on patient functional specific scale Ongoing  8. Patient will be able to do a 2 mile walk with normal to faster gait speed with a rating of 6/10 Ongoing     PLAN:  PT FREQUENCY: 1x/week  PT DURATION: 12 weeks  PLANNED INTERVENTIONS: Therapeutic exercises, Therapeutic activity, Neuromuscular re-education, Patient/Family education, Self Care, Joint mobilization, Aquatic Therapy, Dry Needling, Spinal mobilization, Cryotherapy,  Moist heat, Taping, Traction, Ionotophoresis 4mg /ml Dexamethasone, Manual therapy, and Re-evaluation.  PLAN FOR NEXT SESSION:strengthen left QL, left glute medius, core/trunk strengthening; leg press, try hip machine; DN as needed   Lavinia Sharps, PT 01/03/23 12:19 PM Phone: [phone] Fax: [phone]  [facility] [address] [place], [place] [address] Phone # [phone] Fax [phone]

## 2023-01-08 ENCOUNTER — Ambulatory Visit: Payer: 59 | Admitting: Physical Therapy

## 2023-01-08 DIAGNOSIS — M5459 Other low back pain: Secondary | ICD-10-CM | POA: Diagnosis not present

## 2023-01-08 DIAGNOSIS — R531 Weakness: Secondary | ICD-10-CM

## 2023-01-08 NOTE — Therapy (Signed)
OUTPATIENT PHYSICAL THERAPY TREATMENT NOTE   Patient Name: Kathryn Cohen MRN: [id number] DOB:[birth year], 47 y.o., female Today's Date: 08/27/22  END OF SESSION:  PT End of Session - 01/08/23 0850     Visit Number 23    Date for PT Re-Evaluation 03/04/23    Authorization Type [facility] [employer]    PT Start Time [phone]    PT Stop Time 0930    PT Time Calculation (min) 44 min    Activity Tolerance Patient tolerated treatment well                      Past Medical History:  Diagnosis Date   Allergy    Asthma    Back pain    Breast hypertrophy    Laceration of hand    rt long, ring and small fingers   Neck pain    Past Surgical History:  Procedure Laterality Date   BREAST REDUCTION SURGERY Bilateral 03/14/2017   Procedure: BILATERAL MAMMARY REDUCTION  (BREAST);  Surgeon: Glenna Fellows, MD;  Location: [facility];  Service: Plastics;  Laterality: Bilateral;   NERVE, TENDON AND ARTERY REPAIR Right 02/02/2015   Procedure: RIGHT LONG RING SMALL FINGER REPAIR NERVE, TENDON AND ARTERY REPAIR;  Surgeon: Betha Loa, MD;  Location: [facility];  Service: Orthopedics;  Laterality: Right;   REDUCTION MAMMAPLASTY Bilateral 02/2017   Patient Active Problem List   Diagnosis Date Noted   Asthma 02/24/2011   Allergic rhinitis 02/24/2011    PCP: Polite,Ronald MD  REFERRING PROVIDER: Polite,Ronald MD  REFERRING DIAG: low back pain  Rationale for Evaluation and Treatment: Rehabilitation  THERAPY DIAG:  Back pain; weakness  ONSET DATE: 04/29/2022  SUBJECTIVE:                                                                                                                                                                                           SUBJECTIVE STATEMENT: Did OK at the [facility], took my pillow with me.  I was able to sit 30 minutes.  I was a little sore from the leg press but not too bad.      PERTINENT HISTORY:  Goes by  [name] Ongoing problem related to a fall off horse at 47 years old; resolved but lifted something heavy triggered an exacerbation; then college age back spasms; got PT and acupuncture (bad experience lots of providers, lying lifted leg with weight worsened) Recent illness lying down flared up back Lots of recent travel may have aggravated Patient unable to sit for the evaluation (standing more comfortable) 44 yo daughter PAIN:  PAIN:  Are you having pain? Yes NPRS scale: 1/10 Pain location: feels like a pebble at the tailbone; left low back and lateral hip  Aggravating factors: sitting > 1 hour sharp pains 7/10; walking inclines will hurt  Relieving factors: swims; stretching midback; yoga every morning; usually walking ok on level ground  PRECAUTIONS: None  WEIGHT BEARING RESTRICTIONS: No  FALLS:  Has patient fallen in last 6 months? No  LIVING ENVIRONMENT: Lives with: lives with their family Lives in: House/apartment   OCCUPATION: standing desk, tries to limit sitting  PLOF: Independent  PATIENT GOALS: reduce my pain; more functional I can barely sit at all; wants to travel;  mid June airplane [place];  need to be able to walk normal speed keep up with daughter 39 year old  NEXT MD VISIT: nothing scheduled  OBJECTIVE:   DIAGNOSTIC FINDINGS:  X-rays Mild degen lower thoracic  PATIENT SURVEYS:  Modified Oswestry: 40% moderate disability 6/4: 32% 7/9: 8/50 = 16% goal met  COGNITION: Overall cognitive status: Within functional limits for tasks assessed       MUSCLE LENGTH: Hamstrings: Right 75 deg; Left 75 deg Thomas test: Right 10 deg; Left 10 deg  POSTURE: No Significant postural limitations  PALPATION: Decreased QL mobility  LUMBAR ROM:   AROM eval 6/18 8/13  Flexion Fingertips to shoes with feet together full Full 72  Extension 20 27 34  Right lateral flexion 20 25 45  Left lateral flexion 20 25 45  Right rotation     Left rotation      (Blank rows = not  tested)  LOWER EXTREMITY ROM:   decreased right hip internal rotation ROM 5 degrees; left internal rotation 12 degrees  09/25/2022: Bilateral hip internal rotation is 25 degrees  TRUNK STRENGTH:  Decreased activation of transverse abdominus muscles; abdominals 4/5; decreased activation of lumbar multifidi; trunk extensors 4/5  Bird dog 4/5; modified plank 4/5; bent knee lift and lower= abs 4/5  6/18:  TRUNK STRENGTH GROSSLY 4+/5 8/13: TRUNK STRENGTH:  Decreased activation of transverse abdominus muscles; abdominals 4+/5; decreased activation of lumbar multifidi; trunk extensors 4+/5   LOWER EXTREMITY MMT:    MMT Right eval Left eval 6/11 8/13  Hip flexion 5 5 5 5   Hip extension 4 4 4+ 4+  Hip abduction 4 4 4+ 4+  Hip adduction      Hip internal rotation      Hip external rotation 4 4 4+ 4+  Knee flexion      Knee extension 5 5 5 5   Ankle dorsiflexion      Ankle plantarflexion      Ankle inversion      Ankle eversion       (Blank rows = not tested)  09/25/2022: Right hip flexion strength of 5-/5 Left hip flexion strength of 4+/5  6/18:  left hip flexion 5-/5 8/13:  left hip flexion 5-/5  LUMBAR SPECIAL TESTS:  Straight leg raise test: Negative and Single leg stance test: Negative   GAIT: Comments: decreased gait speed 12/16/2022: 6 MINUTE WALK:  1,366 ft with pt reporting "it just feels irritated" after the walk  The Patient-Specific Functional Scale  Initial:  I am going to ask you to identify up to 3 important activities that you are unable to do or are having difficulty with as a result of this problem.  Today are there any activities that you are unable to do or having difficulty with because of this?  (Patient shown scale and patient rated  each activity)  Follow up: When you first came in you had difficulty performing these activities.  Today do you still have difficulty?  Patient-Specific activity scoring scheme (Point to one  number):  0 1 2 3 4 5 6 7 8 9  10 Unable                                                                                                          Able to perform To perform                                                                                                    activity at the same Activity         Level as before                                                                                                                       Injury or problem  Activity        Picking up bulky items/small box from the floor                                  8/13:       2                2.         Sitting for work/staff meeting                                                                8/13:    1                    3.          Walking at a more normal or faster speed  2 miles  8/13:          4                 TODAY'S TREATMENT:     DATE: 01/08/2023: Nustep level 3 x6 min with PT present to discuss status Sidelying with Pilates ball push with hip abduction 10x right/left Supine Pilates ball isometric holds on hands/thighs with opp arm leg lift off 10x Box squats 8# 16 inch height 10x 1/2 kneel green band horizontal abduction, diagonals 5x each right/left Quadruped hip circles 10x right/left  Leg press seat 6 80# bil 15x Hip machine 40# hip extension 10x right/left Trigger Point Dry-Needling  Treatment instructions: Expect mild to moderate muscle soreness. S/S of pneumothorax if dry needled over a lung field, and to seek immediate medical attention should they occur. Patient verbalized understanding of these instructions and education. Patient Consent Given: Yes Education handout provided: Previously provided Muscles treated: left gluteals and piriformis Electrical stimulation performed: No Parameters: N/A Treatment response/outcome: Utilized skilled palpation to identify bony landmarks and trigger points.  Able to illicit twitch response and muscle  elongation.  Soft tissue mobilization following to further promote tissue elongation.     DATE: 01/03/2023: Nustep level 5 x6 min with PT present to discuss status Supine red ball isometric holds on hands/thighs with opp arm leg lift off 10x 1/2 kneel blue band diagonal chops 10x right/left Dead lift weights 2 8# dumbbells to just below knees x10 FWD step ups onto bosu x10 bilat Side step ups and over on bosu x10 bilat Quadruped bent knee hip extension 10x right/left  Quadruped fire hydrant  x10 bilat Bear plank 7x5 sec hold Sit to stand holding 2 8# dumbbells 10x Leg press seat 6 80# bil 15x Trigger Point Dry-Needling  Treatment instructions: Expect mild to moderate muscle soreness. S/S of pneumothorax if dry needled over a lung field, and to seek immediate medical attention should they occur. Patient verbalized understanding of these instructions and education. Patient Consent Given: Yes Education handout provided: Previously provided Muscles treated: left gluteals and upper ITB Electrical stimulation performed: No Parameters: N/A Treatment response/outcome: Utilized skilled palpation to identify bony landmarks and trigger points.  Able to illicit twitch response and muscle elongation.  Soft tissue mobilization following to further promote tissue elongation.    DATE: 12/24/2022: Nustep level 5 x6 min with PT present to discuss status Dead lift weights 15# kettle bell with legs against RitFit lowering to just below knees 2x10 FWD step ups onto bosu x10 bilat Side step ups and over on bosu x10 bilat Hip flexion lunge with stretch on step 20x bil Quadruped bird dog 2x10 bilat Quadruped Academic librarian with green loop around knees x10 bilat Side-lying reverse clamshell with green loop 2x10 bilat Bear plank 5x5 sec hold Seated lumbar extension with Heavy purple power cord (PT holding resistance with cord around back) 2x10 Side lunge with unilateral foot on slider x10 bilat Backwards  lunge with unilateral foot on slider x10 bilat   12/16/2022 6 MINUTE WALK:  1,366 ft with pt reporting "it just feels irritated" after the walk Dead lift weights 15# kettle bell with legs against RitFit lowering to 4" 2x10 Side step on half foam 2x10 bilat Standing on half foam with some support from other foot on the floor - chest press 4lb weight 2x10 bilat Hip flexion lunge with stretch on step 20x bil Trigger Point Dry-Needling  Treatment instructions: Expect mild to moderate muscle soreness. S/S of pneumothorax if dry needled over a  lung field, and to seek immediate medical attention should they occur. Patient verbalized understanding of these instructions and education. Patient Consent Given: Yes Education handout provided: Previously provided Muscles treated: bilateral thoracic and lumbar multifidi Electrical stimulation performed: No Parameters: N/A Treatment response/outcome: Utilized skilled palpation to identify bony landmarks and trigger points.  Able to illicit twitch response and muscle elongation.  Soft tissue mobilization following to further promote tissue elongation.                                                                                                                       PATIENT EDUCATION:  Education details: Educated patient on anatomy and physiology of current symptoms, prognosis, plan of care as well as initial self care strategies to promote recovery Use of lumbar roll when sitting to avoid sacral sitting Person educated: Patient Education method: Explanation Education comprehension: verbalized understanding  HOME EXERCISE PROGRAM: Access Code: [email or web address] URL: [email or web address] Date: 01/08/2023 Prepared by: Lavinia Sharps  Exercises - Supine Piriformis Stretch (Mirrored)  - 1 x daily - 7 x weekly - 1 sets - 3 reps - 20 hold - Clamshell  - 1 x daily - 7 x weekly - 1 sets - 10 reps - Hooklying Isometric Hip Flexion  - 1 x daily - 7 x  weekly - 1 sets - 5 reps - Hooklying Sequential Leg March and Lower  - 1 x daily - 7 x weekly - 1 sets - 10 reps - Plank on Knees  - 1 x daily - 7 x weekly - 1 sets - 10 reps - Quadruped Hip Extension Kicks  - 1 x daily - 7 x weekly - 2 sets - 10 reps - Quadruped Rocking Slow  - 1 x daily - 7 x weekly - 1 sets - 10 reps - Standing Hip Internal and External Rotation AROM  - 1 x daily - 7 x weekly - 1 sets - 10 reps - Half Kneeling Hip Flexor Stretch with Tri-Planar Reach  - 1 x daily - 7 x weekly - 3 sets - 10 reps - Quadruped Cat Cow  - 1 x daily - 7 x weekly - 3 sets - 10 reps - Quadruped Thoracic Spine Extension  - 1 x daily - 7 x weekly - 3 sets - 10 reps - Quadruped Thoracic Lumbar Side Bend  - 1 x daily - 7 x weekly - 3 sets - 10 reps - Sidelying Reverse Clamshell  - 1 x daily - 7 x weekly - 3 sets - 10 reps - Deep Squat with Pelvic Floor Relaxation  - 1 x daily - 7 x weekly - 3 sets - 10 reps - single leg deadlift with kettlebell or band  - 1 x daily - 7 x weekly - 3 sets - 10 reps - Sidelying Hip Abduction  - 1 x daily - 7 x weekly - 1 sets - 10 reps - Half-Kneeling Shoulder Horizontal Abduction with Resistance  - 1 x daily - 7  x weekly - 1 sets - 10 repsAccess Code: [email or web address]  ASSESSMENT:  CLINICAL IMPRESSION: The patient continues to progress with lumbo/pelvic/hip strengthening with low intensity pain levels. Decreasing tender point size and number. Therapist monitoring response and providing verbal cues to optimize exercise technique. Check progress toward goals next visit for readiness for discharge to independent [facility].  OBJECTIVE IMPAIRMENTS: decreased activity tolerance, decreased mobility, difficulty walking, decreased ROM, decreased strength, increased fascial restrictions, impaired perceived functional ability, and pain.   ACTIVITY LIMITATIONS: sitting, standing, sleeping, and locomotion level  PARTICIPATION LIMITATIONS: driving, community activity, and occupation  PERSONAL  FACTORS: Past/current experiences and Time since onset of injury/illness/exacerbation are also affecting patient's functional outcome.   REHAB POTENTIAL: Good  CLINICAL DECISION MAKING: Stable/uncomplicated  EVALUATION COMPLEXITY: Low   GOALS: Goals reviewed with patient? Yes  SHORT TERM GOALS: Target date: 09/24/2022    Updated (09/18/22)   The patient will demonstrate knowledge of basic self care strategies and exercises to promote healing   Baseline: Goal status: MET  2.  The patient will report a 30% improvement in pain levels with functional activities which are currently difficult including sitting for work and traveling  (upcoming trip to [place]) Baseline:  Goal status: met 6/18 3.  The patient will have improved hip strength to at least 4+/5 needed for standing, walking inclines, walking longer distances  Baseline:  Goal status: MET on 09/25/2022  4. Improved right hip internal rotation ROM to 15 degrees needed for improved  Baseline:  Goal status: MET on 09/25/2022   LONG TERM GOALS: Target date: 03/04/2023   The patient will be independent in a safe self progression of a home exercise program to promote further recovery of function  Baseline:  Goal status: ongoing  2.  The patient will report a 70% improvement in pain levels with functional activities which are currently difficult including sitting, lifting Baseline:   8/13 65% Goal status: ongoing  3.  The patient will have improved trunk flexor and extensor muscle strength to at least 5-/5 needed for lifting medium to heavier weight objects such as moving boxes, laundry and luggage  Baseline: if it is more than around 10 lbs then I feel the discomfort or sore in the low back or tailbone Goal status: ongoing  4.  Improved sitting tolerance for flight to [place] and gait stamina for walking moderate community distances for travel Baseline: able to travel to [place] and did well with that Goal status: MET  5.  Modified  Oswestry score improved to 30% indicating improved function with less pain Baseline: 16% Goal status:MET  6.  Patient will report ability to lift/carry moving boxes improved to 4/10 on Patient functional specific scale NEW  7.  Patient will be to sit for work and meetings with a rating of 4/10 on patient functional specific scale Ongoing  8. Patient will be able to do a 2 mile walk with normal to faster gait speed with a rating of 6/10 Ongoing     PLAN:  PT FREQUENCY: 1x/week  PT DURATION: 12 weeks  PLANNED INTERVENTIONS: Therapeutic exercises, Therapeutic activity, Neuromuscular re-education, Patient/Family education, Self Care, Joint mobilization, Aquatic Therapy, Dry Needling, Spinal mobilization, Cryotherapy, Moist heat, Taping, Traction, Ionotophoresis 4mg /ml Dexamethasone, Manual therapy, and Re-evaluation.  PLAN FOR NEXT SESSION: possible DC next visit; strengthen left QL, left glute medius, core/trunk strengthening; leg press, try hip machine; DN as needed   Lavinia Sharps, PT 01/08/23 9:17 PM Phone: [phone] Fax: [phone]  [facility] [phone]  [address], [address] [place], [place] [address] Phone # [phone] Fax [phone]

## 2023-01-10 DIAGNOSIS — F4322 Adjustment disorder with anxiety: Secondary | ICD-10-CM | POA: Diagnosis not present

## 2023-01-14 ENCOUNTER — Ambulatory Visit: Payer: 59 | Admitting: Physical Therapy

## 2023-01-14 DIAGNOSIS — R531 Weakness: Secondary | ICD-10-CM

## 2023-01-14 DIAGNOSIS — M5459 Other low back pain: Secondary | ICD-10-CM

## 2023-01-14 NOTE — Therapy (Signed)
OUTPATIENT PHYSICAL THERAPY TREATMENT NOTE/DISCHARGE SUMMARY   Patient Name: Kathryn Cohen MRN: [id number] DOB:1976-01-18, 47 y.o., female Today's Date: 08/27/22  END OF SESSION:  PT End of Session - 01/14/23 0846     Visit Number 24    Date for PT Re-Evaluation 03/04/23    Authorization Type [facility] [employer]    PT Start Time [phone]    PT Stop Time 0926    PT Time Calculation (min) 40 min    Activity Tolerance Patient tolerated treatment well                      Past Medical History:  Diagnosis Date   Allergy    Asthma    Back pain    Breast hypertrophy    Laceration of hand    rt long, ring and small fingers   Neck pain    Past Surgical History:  Procedure Laterality Date   BREAST REDUCTION SURGERY Bilateral 03/14/2017   Procedure: BILATERAL MAMMARY REDUCTION  (BREAST);  Surgeon: Glenna Fellows, MD;  Location: [facility];  Service: Plastics;  Laterality: Bilateral;   NERVE, TENDON AND ARTERY REPAIR Right 02/02/2015   Procedure: RIGHT LONG RING SMALL FINGER REPAIR NERVE, TENDON AND ARTERY REPAIR;  Surgeon: Betha Loa, MD;  Location: [facility];  Service: Orthopedics;  Laterality: Right;   REDUCTION MAMMAPLASTY Bilateral 02/2017   Patient Active Problem List   Diagnosis Date Noted   Asthma 02/24/2011   Allergic rhinitis 02/24/2011    PCP: Polite,Ronald MD  REFERRING PROVIDER: Polite,Ronald MD  REFERRING DIAG: low back pain  Rationale for Evaluation and Treatment: Rehabilitation  THERAPY DIAG:  Back pain; weakness  ONSET DATE: 04/29/2022  SUBJECTIVE:                                                                                                                                                                                           SUBJECTIVE STATEMENT: I'm doing OK.  Feel ready for discharge from PT today.  PERTINENT HISTORY:  Goes by [name] Ongoing problem related to a fall off horse at 47 years old;  resolved but lifted something heavy triggered an exacerbation; then college age back spasms; got PT and acupuncture (bad experience lots of providers, lying lifted leg with weight worsened) Recent illness lying down flared up back Lots of recent travel may have aggravated Patient unable to sit for the evaluation (standing more comfortable) 58 yo daughter PAIN:  PAIN:  Are you having pain? Yes NPRS scale: 1/10 Pain location: feels like a pebble at the tailbone; left low back and lateral hip  Aggravating  factors: sitting > 1 hour sharp pains 7/10; walking inclines will hurt  Relieving factors: swims; stretching midback; yoga every morning; usually walking ok on level ground  PRECAUTIONS: None  WEIGHT BEARING RESTRICTIONS: No  FALLS:  Has patient fallen in last 6 months? No  LIVING ENVIRONMENT: Lives with: lives with their family Lives in: House/apartment   OCCUPATION: standing desk, tries to limit sitting  PLOF: Independent  PATIENT GOALS: reduce my pain; more functional I can barely sit at all; wants to travel;  mid June airplane [place];  need to be able to walk normal speed keep up with daughter 47 year old  NEXT MD VISIT: nothing scheduled  OBJECTIVE:   DIAGNOSTIC FINDINGS:  X-rays Mild degen lower thoracic  PATIENT SURVEYS:  Modified Oswestry: 40% moderate disability 6/4: 32% 7/9: 8/50 = 16% goal met  COGNITION: Overall cognitive status: Within functional limits for tasks assessed       MUSCLE LENGTH: Hamstrings: Right 75 deg; Left 75 deg Thomas test: Right 10 deg; Left 10 deg  POSTURE: No Significant postural limitations  PALPATION: Decreased QL mobility  LUMBAR ROM:   AROM eval 6/18 8/13  Flexion Fingertips to shoes with feet together full Full 72  Extension 20 27 34  Right lateral flexion 20 25 45  Left lateral flexion 20 25 45  Right rotation     Left rotation      (Blank rows = not tested)  LOWER EXTREMITY ROM:   decreased right hip internal  rotation ROM 5 degrees; left internal rotation 12 degrees  09/25/2022: Bilateral hip internal rotation is 25 degrees 9/17: hip internal rotation 30 degrees bil  TRUNK STRENGTH:  Decreased activation of transverse abdominus muscles; abdominals 4/5; decreased activation of lumbar multifidi; trunk extensors 4/5  Bird dog 4/5; modified plank 4/5; bent knee lift and lower= abs 4/5  6/18:  TRUNK STRENGTH GROSSLY 4+/5 8/13: TRUNK STRENGTH:  Decreased activation of transverse abdominus muscles; abdominals 4+/5; decreased activation of lumbar multifidi; trunk extensors 4+/5  9/17: Modified plank 23 sec  LOWER EXTREMITY MMT:    MMT Right eval Left eval 6/11 8/13 9/17  Hip flexion 5 5 5 5 5   Hip extension 4 4 4+ 4+ 5  Hip abduction 4 4 4+ 4+ 5-  Hip adduction       Hip internal rotation       Hip external rotation 4 4 4+ 4+ 5-  Knee flexion       Knee extension 5 5 5 5 5   Ankle dorsiflexion       Ankle plantarflexion       Ankle inversion       Ankle eversion        (Blank rows = not tested)  09/25/2022: Right hip flexion strength of 5-/5 Left hip flexion strength of 4+/5  6/18:  left hip flexion 5-/5 8/13:  left hip flexion 5-/5 9/17: bil hip flexion 5/5 9/17:SLS > 20 sec bil without pelvic drop or trunk leans; trunk strength grossly 5-/5  LUMBAR SPECIAL TESTS:  Straight leg raise test: Negative and Single leg stance test: Negative   GAIT: Comments: decreased gait speed 12/16/2022: 6 MINUTE WALK:  1,366 ft with pt reporting "it just feels irritated" after the walk  The Patient-Specific Functional Scale  Initial:  I am going to ask you to identify up to 3 important activities that you are unable to do or are having difficulty with as a result of this problem.  Today are there any activities  that you are unable to do or having difficulty with because of this?  (Patient shown scale and patient rated each activity)  Follow up: When you first came in you had difficulty performing  these activities.  Today do you still have difficulty?  Patient-Specific activity scoring scheme (Point to one number):  0 1 2 3 4 5 6 7 8 9  10 Unable                                                                                                          Able to perform To perform                                                                                                    activity at the same Activity         Level as before                                                                                                                       Injury or problem  Activity        Picking up bulky items/small box from the floor                                  8/13:       2              9/17:  6  2.         Sitting for work/staff meeting                                                                8/13:    1              9/17: 4      3.          Walking at  a more normal or faster speed  2 miles                             8/13:          4         9/17: 5         TODAY'S TREATMENT:     DATE: 01/14/2023: Nustep level 3 x6 min with PT present to discuss status Movement and strength assessment PSFS recheck Clams sidelying with red and green band Update of HEP to include standing band ex: pallof press, stir the pot Side planks (added to HEP) Review of HEP DATE: 01/08/2023: Nustep level 3 x6 min with PT present to discuss status Sidelying with Pilates ball push with hip abduction 10x right/left Supine Pilates ball isometric holds on hands/thighs with opp arm leg lift off 10x Box squats 8# 16 inch height 10x 1/2 kneel green band horizontal abduction, diagonals 5x each right/left Quadruped hip circles 10x right/left  Leg press seat 6 80# bil 15x Hip machine 40# hip extension 10x right/left Trigger Point Dry-Needling  Treatment instructions: Expect mild to moderate muscle soreness. S/S of pneumothorax if dry needled over a lung field, and to seek immediate medical attention should  they occur. Patient verbalized understanding of these instructions and education. Patient Consent Given: Yes Education handout provided: Previously provided Muscles treated: left gluteals and piriformis Electrical stimulation performed: No Parameters: N/A Treatment response/outcome: Utilized skilled palpation to identify bony landmarks and trigger points.  Able to illicit twitch response and muscle elongation.  Soft tissue mobilization following to further promote tissue elongation.                                                   PATIENT EDUCATION:  Education details: Educated patient on anatomy and physiology of current symptoms, prognosis, plan of care as well as initial self care strategies to promote recovery Use of lumbar roll when sitting to avoid sacral sitting Person educated: Patient Education method: Explanation Education comprehension: verbalized understanding  HOME EXERCISE PROGRAM: Access Code: [email or web address] URL: [email or web address] Date: 01/14/2023 Prepared by: Lavinia Sharps  Exercises - Supine Piriformis Stretch (Mirrored)  - 1 x daily - 7 x weekly - 1 sets - 3 reps - 20 hold - Hooklying Isometric Hip Flexion  - 1 x daily - 7 x weekly - 1 sets - 5 reps - Hooklying Sequential Leg March and Lower  - 1 x daily - 7 x weekly - 1 sets - 10 reps - Plank on Knees  - 1 x daily - 7 x weekly - 1 sets - 10 reps - Quadruped Hip Extension Kicks  - 1 x daily - 7 x weekly - 2 sets - 10 reps - Quadruped Rocking Slow  - 1 x daily - 7 x weekly - 1 sets - 10 reps - Standing Hip Internal and External Rotation AROM  - 1 x daily - 7 x weekly - 1 sets - 10 reps - Half Kneeling Hip Flexor Stretch with Tri-Planar Reach  - 1 x daily - 7 x weekly - 3 sets - 10 reps - Quadruped Cat Cow  - 1 x daily - 7 x weekly - 3 sets - 10 reps - Quadruped Thoracic Spine Extension  - 1 x daily -  7 x weekly - 3 sets - 10 reps - Quadruped Thoracic Lumbar Side Bend  - 1 x daily - 7 x weekly - 3 sets -  10 reps - Sidelying Reverse Clamshell  - 1 x daily - 7 x weekly - 3 sets - 10 reps - Deep Squat with Pelvic Floor Relaxation  - 1 x daily - 7 x weekly - 3 sets - 10 reps - single leg deadlift with kettlebell or band  - 1 x daily - 7 x weekly - 3 sets - 10 reps - Sidelying Hip Abduction  - 1 x daily - 7 x weekly - 1 sets - 10 reps - Half-Kneeling Shoulder Horizontal Abduction with Resistance  - 1 x daily - 7 x weekly - 1 sets - 10 reps - Standing Anti-Rotation Press with Anchored Resistance  - 1 x daily - 7 x weekly - 1 sets - 10 reps - Stir the Pot  - 1 x daily - 7 x weekly - 1 sets - 10 reps - Clam with Resistance  - 1 x daily - 7 x weekly - 1 sets - 10 reps - Side Plank on Knees  - 1 x daily - 7 x weekly - 1 sets - 5 reps  ASSESSMENT:  CLINICAL IMPRESSION: The patient has met the majority of rehab goals, with noted improvements in pain reduction, outcome score, ROM, strength and functional mobility.  A comprehensive HEP has been established and anticipate further improvements over time with regular performance of the program.  Recommend discharge from PT at this time.   OBJECTIVE IMPAIRMENTS: decreased activity tolerance, decreased mobility, difficulty walking, decreased ROM, decreased strength, increased fascial restrictions, impaired perceived functional ability, and pain.   ACTIVITY LIMITATIONS: sitting, standing, sleeping, and locomotion level  PARTICIPATION LIMITATIONS: driving, community activity, and occupation  PERSONAL FACTORS: Past/current experiences and Time since onset of injury/illness/exacerbation are also affecting patient's functional outcome.   REHAB POTENTIAL: Good  CLINICAL DECISION MAKING: Stable/uncomplicated  EVALUATION COMPLEXITY: Low   GOALS: Goals reviewed with patient? Yes  SHORT TERM GOALS: Target date: 09/24/2022    Updated (09/18/22)   The patient will demonstrate knowledge of basic self care strategies and exercises to promote healing    Baseline: Goal status: MET  2.  The patient will report a 30% improvement in pain levels with functional activities which are currently difficult including sitting for work and traveling  (upcoming trip to [place]) Baseline:  Goal status: met 6/18 3.  The patient will have improved hip strength to at least 4+/5 needed for standing, walking inclines, walking longer distances  Baseline:  Goal status: MET on 09/25/2022  4. Improved right hip internal rotation ROM to 15 degrees needed for improved  Baseline:  Goal status: MET on 09/25/2022   LONG TERM GOALS: Target date: 03/04/2023   The patient will be independent in a safe self progression of a home exercise program to promote further recovery of function  Baseline:  Goal status: met 9/17  2.  The patient will report a 70% improvement in pain levels with functional activities which are currently difficult including sitting, lifting Baseline:   8/13 65% Goal status: ongoing  3.  The patient will have improved trunk flexor and extensor muscle strength to at least 5-/5 needed for lifting medium to heavier weight objects such as moving boxes, laundry and luggage  Baseline: if it is more than around 10 lbs then I feel the discomfort or sore in the low back or tailbone  Goal status: met 9/17  4.  Improved sitting tolerance for flight to [place] and gait stamina for walking moderate community distances for travel Baseline: able to travel to [place] and did well with that Goal status: MET  5.  Modified Oswestry score improved to 30% indicating improved function with less pain Baseline: 16% Goal status:MET  6.  Patient will report ability to lift/carry moving boxes improved to 4/10 on Patient functional specific scale Met 9/17  7.  Patient will be to sit for work and meetings with a rating of 4/10 on patient functional specific scale Met 9/17  8. Patient will be able to do a 2 mile walk with normal to faster gait speed with a rating of  6/10 Partially met     PLAN: PHYSICAL THERAPY DISCHARGE SUMMARY  Visits from Start of Care: 24  Current functional level related to goals / functional outcomes: See clinical impressions above   Remaining deficits: As above   Education / Equipment: Comprehensive HEP   Patient agrees to discharge. Patient goals were met. Patient is being discharged due to meeting the stated rehab goals.   Lavinia Sharps, PT 01/14/23 9:28 AM Phone: [phone] Fax: [phone]  [facility] [address] [place], [place] [address] Phone # [phone] Fax [phone]

## 2023-01-21 ENCOUNTER — Encounter: Payer: 59 | Admitting: Physical Therapy

## 2023-01-21 DIAGNOSIS — Z13220 Encounter for screening for lipoid disorders: Secondary | ICD-10-CM | POA: Diagnosis not present

## 2023-01-21 DIAGNOSIS — Z0000 Encounter for general adult medical examination without abnormal findings: Secondary | ICD-10-CM | POA: Diagnosis not present

## 2023-01-21 DIAGNOSIS — J45909 Unspecified asthma, uncomplicated: Secondary | ICD-10-CM | POA: Diagnosis not present

## 2023-01-22 ENCOUNTER — Other Ambulatory Visit (HOSPITAL_COMMUNITY): Payer: Self-pay

## 2023-01-22 MED ORDER — INFLUENZA VIRUS VACC SPLIT PF (FLUZONE) 0.5 ML IM SUSY
0.5000 mL | PREFILLED_SYRINGE | Freq: Once | INTRAMUSCULAR | 0 refills | Status: AC
  Filled 2023-01-22: qty 0.5, 1d supply, fill #0

## 2023-01-22 MED ORDER — COVID-19 MRNA VAC-TRIS(PFIZER) 30 MCG/0.3ML IM SUSY
0.3000 mL | PREFILLED_SYRINGE | Freq: Once | INTRAMUSCULAR | 0 refills | Status: AC
  Filled 2023-01-22: qty 0.3, 1d supply, fill #0

## 2023-01-28 ENCOUNTER — Encounter: Payer: 59 | Admitting: Physical Therapy

## 2023-02-04 ENCOUNTER — Encounter: Payer: 59 | Admitting: Physical Therapy

## 2023-02-11 ENCOUNTER — Encounter: Payer: 59 | Admitting: Physical Therapy

## 2023-02-18 ENCOUNTER — Encounter: Payer: 59 | Admitting: Physical Therapy

## 2023-02-20 ENCOUNTER — Other Ambulatory Visit (HOSPITAL_COMMUNITY): Payer: Self-pay

## 2023-02-25 ENCOUNTER — Encounter: Payer: 59 | Admitting: Physical Therapy

## 2023-03-04 ENCOUNTER — Encounter: Payer: 59 | Admitting: Rehabilitative and Restorative Service Providers"

## 2023-03-07 ENCOUNTER — Other Ambulatory Visit (HOSPITAL_COMMUNITY): Payer: Self-pay

## 2023-03-07 ENCOUNTER — Encounter: Payer: Self-pay | Admitting: Pharmacist

## 2023-03-07 ENCOUNTER — Other Ambulatory Visit: Payer: Self-pay

## 2023-03-07 MED ORDER — FLUTICASONE-SALMETEROL 100-50 MCG/ACT IN AEPB
1.0000 | INHALATION_SPRAY | Freq: Two times a day (BID) | RESPIRATORY_TRACT | 3 refills | Status: AC
  Filled 2023-03-07 – 2023-03-13 (×4): qty 180, 90d supply, fill #0

## 2023-03-10 ENCOUNTER — Other Ambulatory Visit: Payer: Self-pay

## 2023-03-10 ENCOUNTER — Other Ambulatory Visit (HOSPITAL_COMMUNITY): Payer: Self-pay

## 2023-03-10 ENCOUNTER — Encounter: Payer: Self-pay | Admitting: Pharmacist

## 2023-03-12 ENCOUNTER — Other Ambulatory Visit: Payer: Self-pay

## 2023-03-13 ENCOUNTER — Other Ambulatory Visit (HOSPITAL_COMMUNITY): Payer: Self-pay

## 2023-04-29 ENCOUNTER — Other Ambulatory Visit (HOSPITAL_COMMUNITY): Payer: Self-pay

## 2024-04-29 IMAGING — MG MM DIGITAL SCREENING BILAT W/ TOMO AND CAD
6 of 10 series · 6 of 30 positions shown · non-contrast
Comparison: Previous exam(s).

CLINICAL DATA: Screening.

EXAM:
DIGITAL SCREENING BILATERAL MAMMOGRAM WITH TOMOSYNTHESIS AND CAD
TECHNIQUE: Bilateral screening digital craniocaudal and mediolateral oblique
mammograms were obtained. Bilateral screening digital breast
tomosynthesis was performed. The images were evaluated with
computer-aided detection.

[L CC synth-2D]
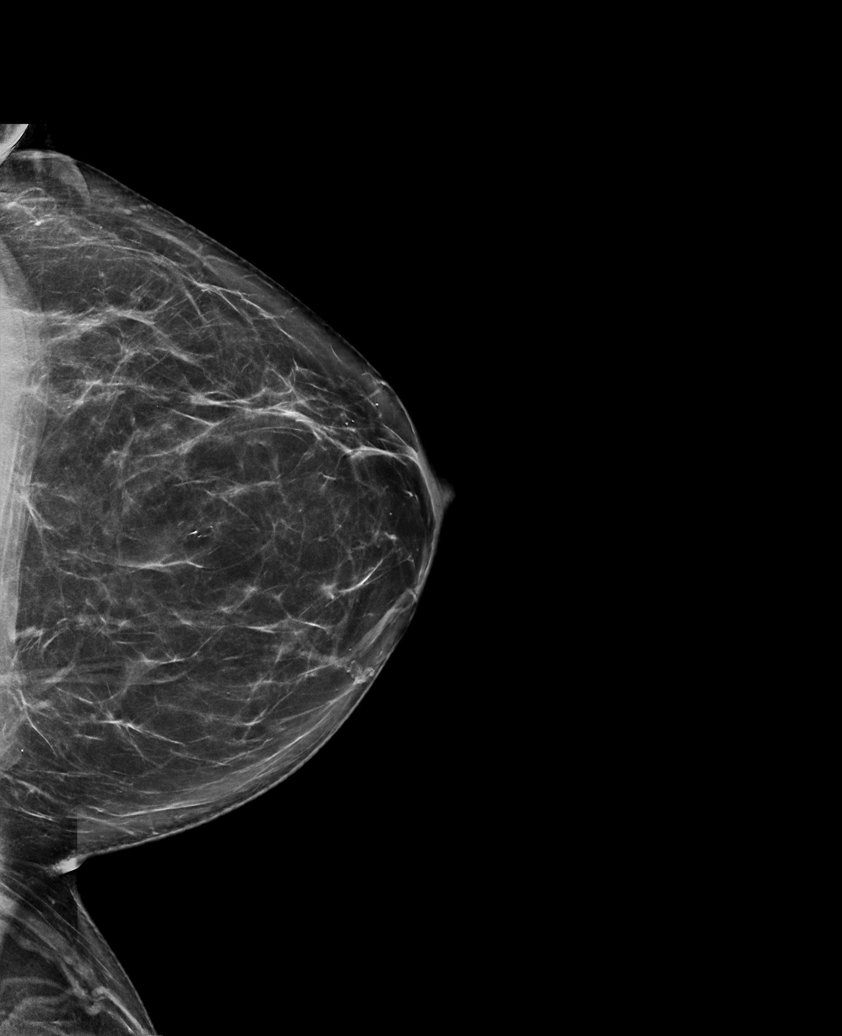

[L MLO synth-2D]
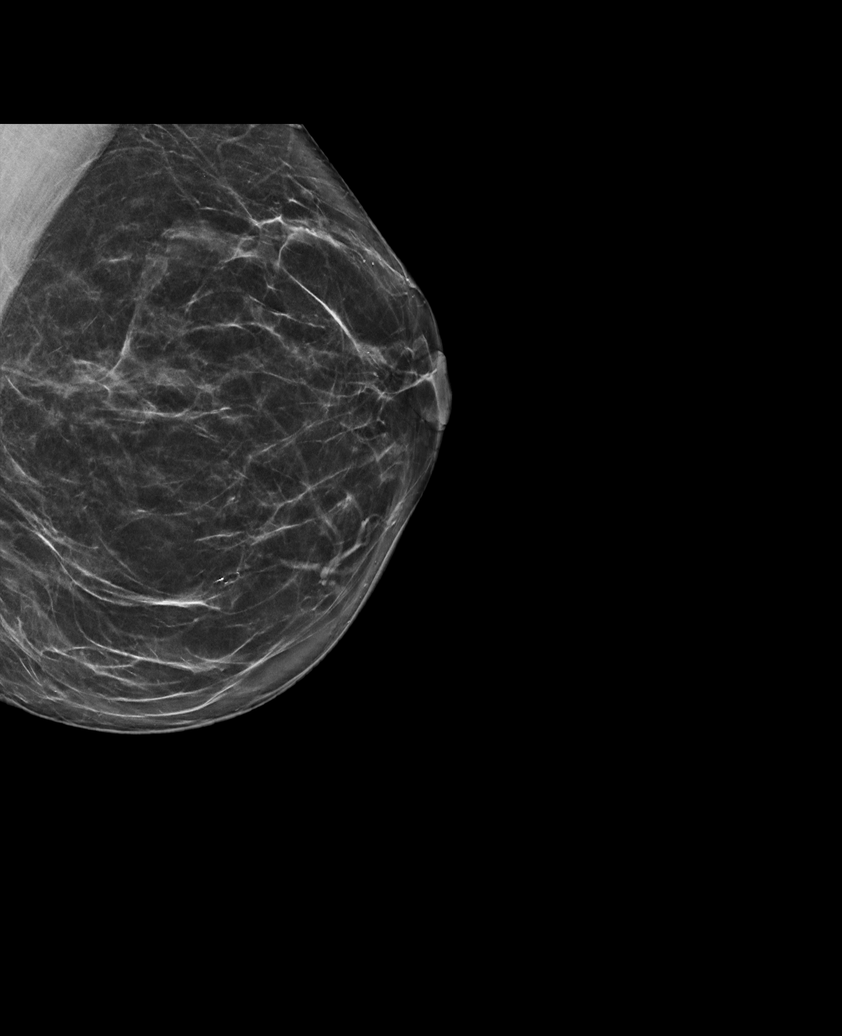

[R CC synth-2D (1 of 2)]
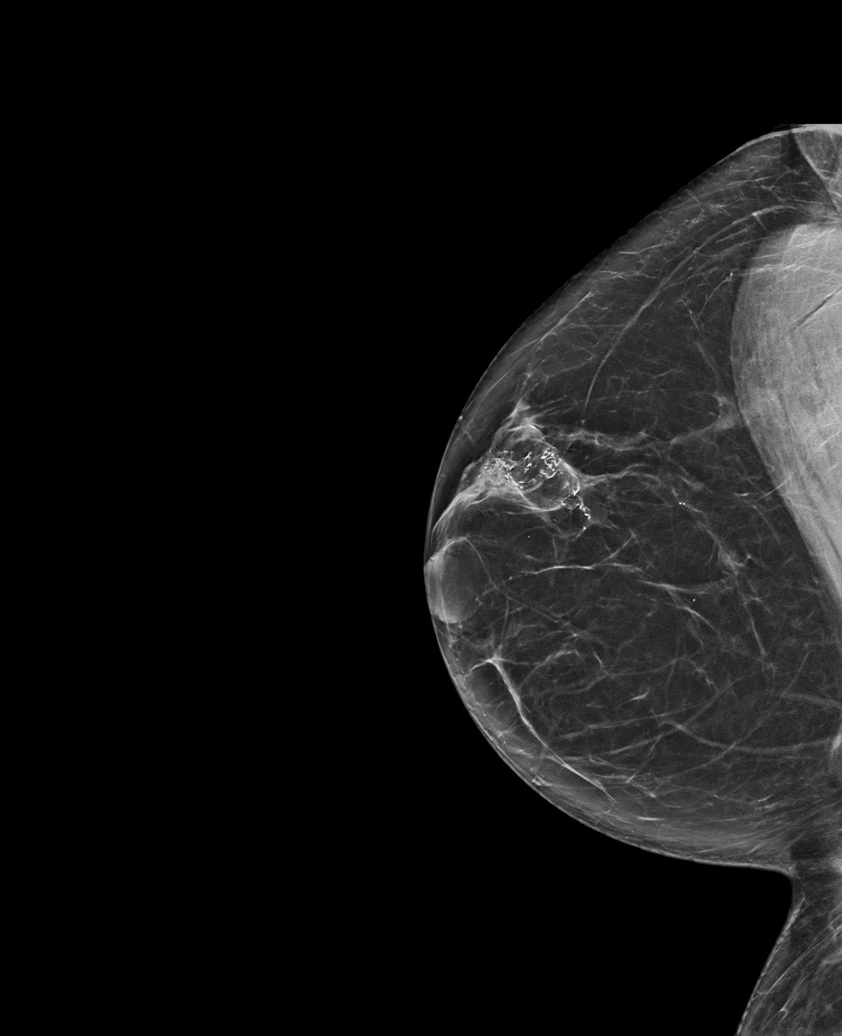

[R MLO synth-2D]
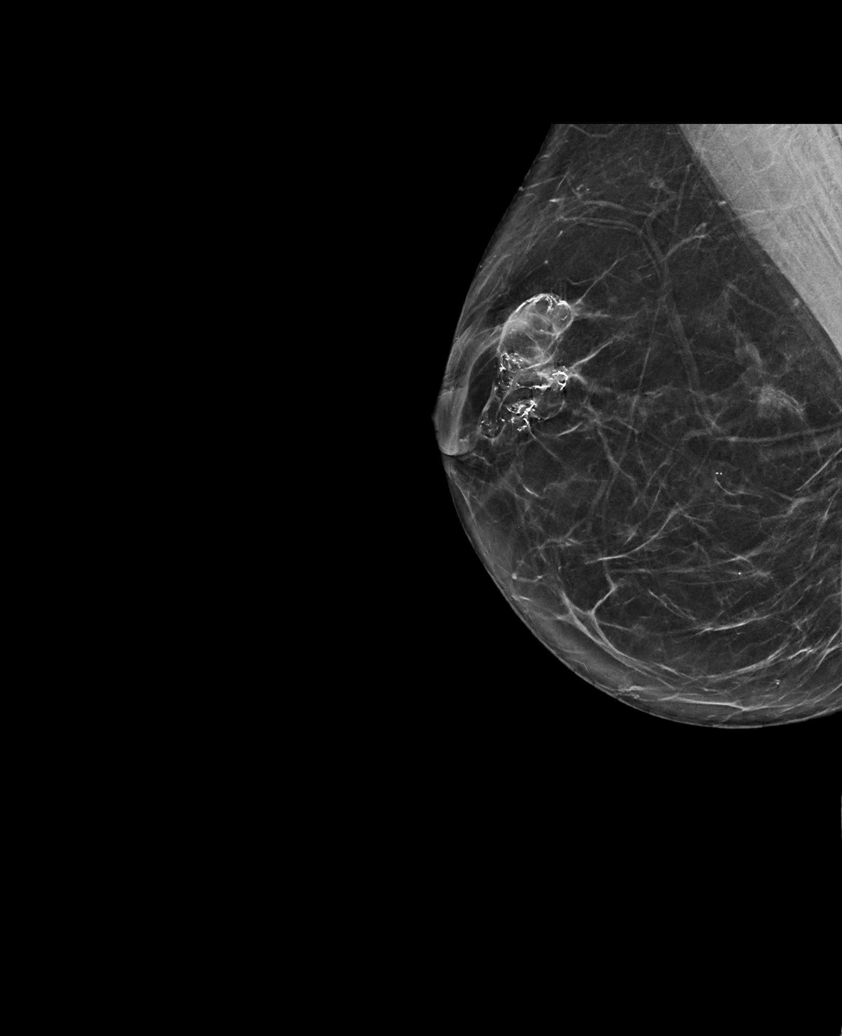

[R CC synth-2D (2 of 2)]
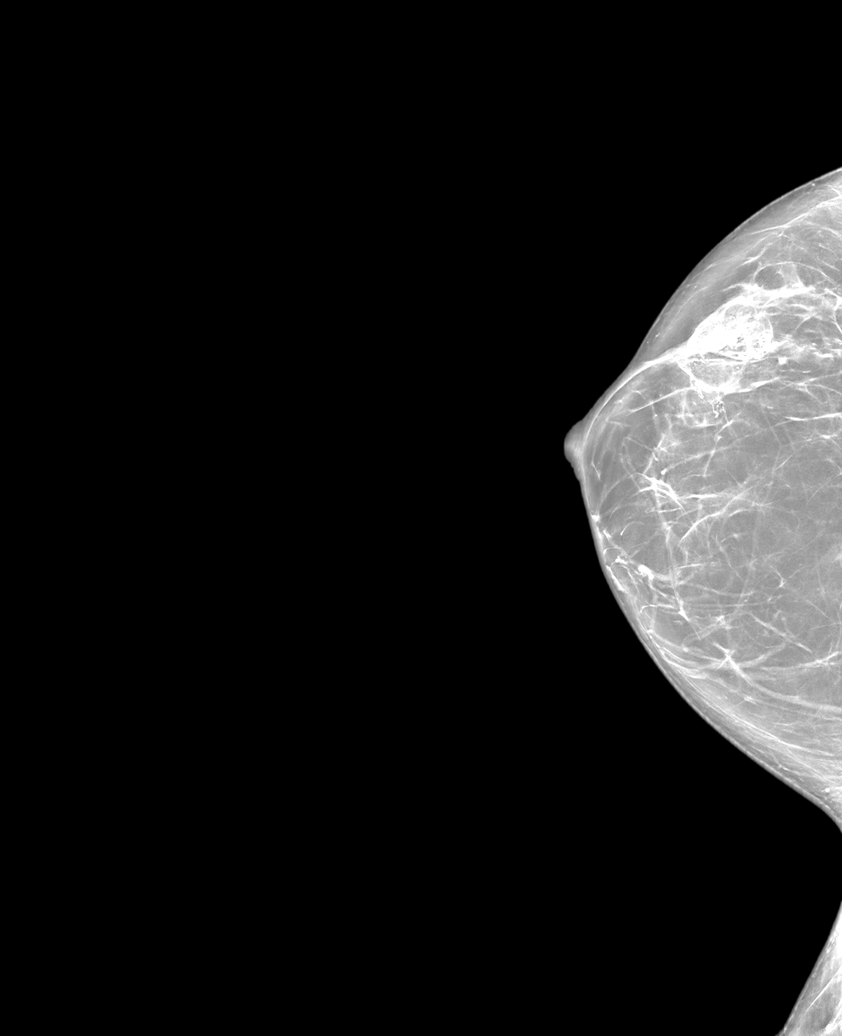

[R MLO tomo · tomo slice 35/69.0]
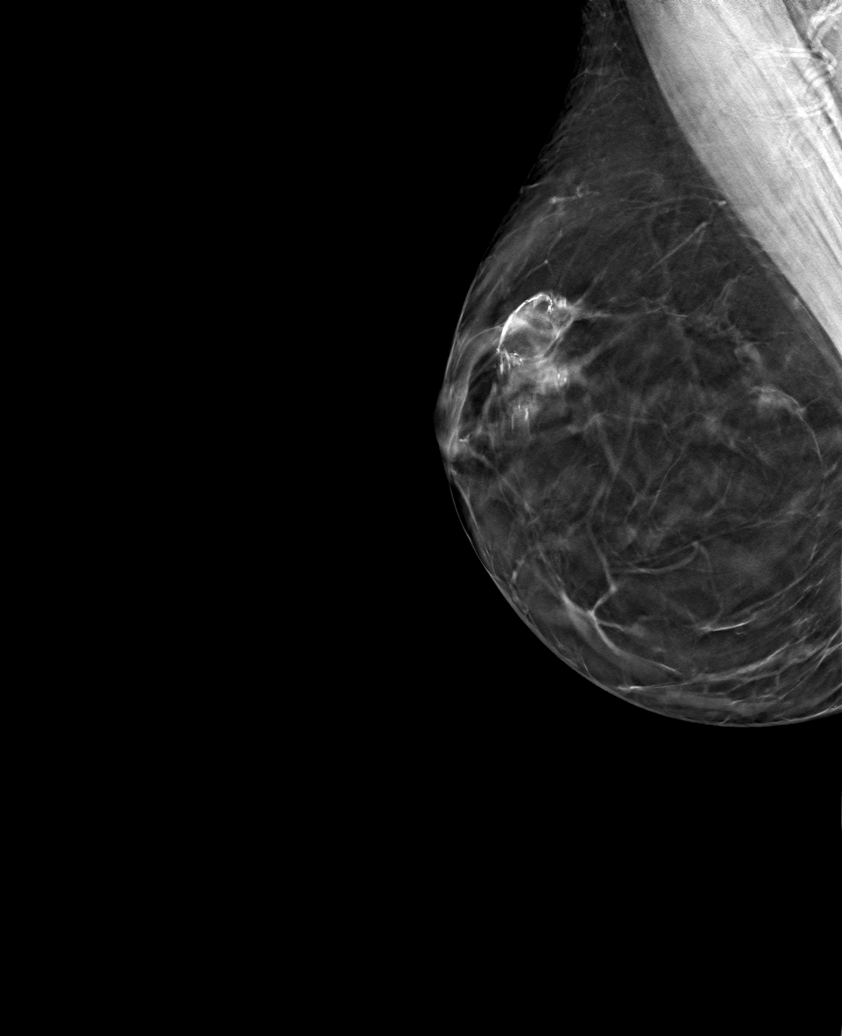

[6 of 30 positions shown; findings below may reference images not displayed]

ACR Breast Density Category b: There are scattered areas of
fibroglandular density.
FINDINGS: There are no findings suspicious for malignancy.
IMPRESSION: No mammographic evidence of malignancy. A result letter of this
screening mammogram will be mailed directly to the patient.

RECOMMENDATION:
Screening mammogram in one year. (Code:51-O-LD2)

BI-RADS CATEGORY  1: Negative.
